# Patient Record
Sex: Female | Born: 1935 | Race: White | Hispanic: No | Marital: Single | Smoking: Never smoker
Health system: Southern US, Community
[De-identification: ages and names within clinical notes are randomized; demographics above are authoritative.]

## PROBLEM LIST (undated history)

## (undated) DIAGNOSIS — E785 Hyperlipidemia, unspecified: Secondary | ICD-10-CM

## (undated) DIAGNOSIS — Z860100 Personal history of colon polyps, unspecified: Secondary | ICD-10-CM

## (undated) DIAGNOSIS — M810 Age-related osteoporosis without current pathological fracture: Secondary | ICD-10-CM

## (undated) HISTORY — DX: Age-related osteoporosis without current pathological fracture: M81.0

## (undated) HISTORY — DX: Personal history of colonic polyps: Z86.010

## (undated) HISTORY — DX: Hyperlipidemia, unspecified: E78.5

## (undated) HISTORY — DX: Personal history of colon polyps, unspecified: Z86.0100

---

## 2005-07-15 HISTORY — PX: BREAST BIOPSY: SHX20

## 2006-07-24 LAB — HM COLONOSCOPY: HM Colonoscopy: NORMAL

## 2010-11-22 ENCOUNTER — Ambulatory Visit: Payer: Self-pay | Admitting: Family Medicine

## 2010-11-27 ENCOUNTER — Ambulatory Visit: Payer: Self-pay | Admitting: Family Medicine

## 2012-05-11 ENCOUNTER — Ambulatory Visit: Payer: Self-pay | Admitting: Family Medicine

## 2012-12-28 ENCOUNTER — Ambulatory Visit: Payer: Self-pay | Admitting: Rheumatology

## 2013-04-14 LAB — HM MAMMOGRAPHY: HM Mammogram: NORMAL

## 2013-05-13 ENCOUNTER — Ambulatory Visit: Payer: Self-pay | Admitting: Family Medicine

## 2013-06-02 ENCOUNTER — Ambulatory Visit: Payer: Self-pay | Admitting: Family Medicine

## 2014-02-24 ENCOUNTER — Ambulatory Visit (INDEPENDENT_AMBULATORY_CARE_PROVIDER_SITE_OTHER): Payer: PRIVATE HEALTH INSURANCE | Admitting: Adult Health

## 2014-02-24 ENCOUNTER — Encounter: Payer: Self-pay | Admitting: Adult Health

## 2014-02-24 ENCOUNTER — Encounter (INDEPENDENT_AMBULATORY_CARE_PROVIDER_SITE_OTHER): Payer: Self-pay

## 2014-02-24 VITALS — BP 129/75 | HR 73 | Temp 97.8°F | Resp 14 | Ht 58.75 in | Wt 114.5 lb

## 2014-02-24 DIAGNOSIS — E785 Hyperlipidemia, unspecified: Secondary | ICD-10-CM

## 2014-02-24 DIAGNOSIS — M810 Age-related osteoporosis without current pathological fracture: Secondary | ICD-10-CM | POA: Insufficient documentation

## 2014-02-24 NOTE — Progress Notes (Signed)
Patient ID: Janet Shields, female   DOB: 1935/10/21, 78 y.o.   MRN: [id number]   Subjective:    Patient ID: Janet Shields, female    DOB: 1935/10/21, 78 y.o.   MRN: [id number]  HPI  Pt is a 78 y/o female who presents to clinic to establish care. Followed by [facility]. She has a hx of HLD and osteoporosis. She takes medications for both and reports compliance.   Past Medical History  Diagnosis Date  . Hyperlipidemia   . Osteoporosis   . History of colon polyps      Past Surgical History  Procedure Laterality Date  . Breast biopsy  2007     Family History  Problem Relation Age of Onset  . Arthritis Mother   . Alcohol abuse Father   . Heart disease Father [id number]    CAD - died  . Hyperlipidemia Brother      History   Social History  . Marital Status: Divorced    Spouse Name: N/A    Number of Children: 1  . 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 Smoking status: Never Smoker   . Smokeless tobacco: Not on file  . Alcohol Use: Yes     Comment: Occasional wine with dinner  . Drug Use: No  . Sexual Activity: Not on file   Other Topics Concern  . Not on file   Social History Narrative   Hobbies: flower gardening, read, travel   Caffeine: 4 cups of coffee   Exercise: 3 miles on treadmill 4-5 a week, resistance exercises at [employer] 2x/week    Review of Systems  Constitutional: Negative.   HENT: Negative.   Eyes: Negative.   Respiratory: Negative.   Cardiovascular: Negative.   Gastrointestinal: Negative.   Endocrine: Negative.   Genitourinary: Negative.   Musculoskeletal: Negative.   Skin: Negative.   Allergic/Immunologic: Negative.   Neurological: Negative.   Hematological: Negative.   Psychiatric/Behavioral: Negative.        Objective:  BP 129/75  Pulse 73  Temp(Src) 97.8 F (36.6 C) (Oral)  Resp 14  Ht 4' 10.75" (1.492 m)  Wt 114 lb 8 oz (51.937 kg)  BMI 23.33 kg/m2   SpO2 99%   Physical Exam  Constitutional: She is oriented to person, place, and time. No distress.  Cardiovascular: Normal rate and regular rhythm.   Pulmonary/Chest: Effort normal. No respiratory distress.  Musculoskeletal: Normal range of motion.  Neurological: She is alert and oriented to person, place, and time.  Skin: Skin is warm and dry.  Psychiatric: She has a normal mood and affect. Her behavior is normal. Judgment and thought content normal.      Assessment & Plan:   1. HLD (hyperlipidemia) On statin. Reports following healthy diet and exercise. Follow. She will schedule her wellness/physical exam in early September where we will draw labs.  2. Osteoporosis On Prolia. Exercises regularly. Follow.

## 2014-02-24 NOTE — Progress Notes (Signed)
Pre visit review using our clinic review tool, if applicable. No additional management support is needed unless otherwise documented below in the visit note. 

## 2014-03-24 ENCOUNTER — Ambulatory Visit (INDEPENDENT_AMBULATORY_CARE_PROVIDER_SITE_OTHER): Payer: PRIVATE HEALTH INSURANCE | Admitting: Adult Health

## 2014-03-24 ENCOUNTER — Encounter: Payer: Self-pay | Admitting: Adult Health

## 2014-03-24 DIAGNOSIS — Z23 Encounter for immunization: Secondary | ICD-10-CM

## 2014-03-24 DIAGNOSIS — Z136 Encounter for screening for cardiovascular disorders: Secondary | ICD-10-CM

## 2014-03-24 DIAGNOSIS — Z0000 Encounter for general adult medical examination without abnormal findings: Secondary | ICD-10-CM

## 2014-03-24 DIAGNOSIS — Z131 Encounter for screening for diabetes mellitus: Secondary | ICD-10-CM

## 2014-03-24 NOTE — Progress Notes (Signed)
Subjective:    Janet Shields is a 78 y.o. female who presents for Medicare Annual/Subsequent preventive examination.  Preventive Screening-Counseling & Management  Tobacco History  Smoking status  . Never Smoker   Smokeless tobacco  . Not on file     Problems Prior to Visit 1.   Current Problems (verified) Patient Active Problem List   Diagnosis Date Noted  . HLD (hyperlipidemia) 02/24/2014  . Osteoporosis 02/24/2014    Medications Prior to Visit Current Outpatient Prescriptions on File Prior to Visit  Medication Sig Dispense Refill  . aspirin 81 MG tablet Take 81 mg by mouth daily.      Marland Kitchen BIOTIN 5000 PO Take 1 tablet by mouth daily.      . Calcium Citrate-Vitamin D (CAL-CITRATE PLUS VITAMIN D PO) Take 1 tablet by mouth daily.      Marland Kitchen denosumab (PROLIA) 60 MG/ML SOLN injection Inject 60 mg into the skin every 6 (six) months. Administer in upper arm, thigh, or abdomen      . Multiple Vitamin (MULTIVITAMIN) tablet Take 1 tablet by mouth daily.      . simvastatin (ZOCOR) 10 MG tablet Take 10 mg by mouth daily.       No current facility-administered medications on file prior to visit.    Current Medications (verified) Current Outpatient Prescriptions  Medication Sig Dispense Refill  . aspirin 81 MG tablet Take 81 mg by mouth daily.      Marland Kitchen BIOTIN 5000 PO Take 1 tablet by mouth daily.      . Calcium Citrate-Vitamin D (CAL-CITRATE PLUS VITAMIN D PO) Take 1 tablet by mouth daily.      Marland Kitchen denosumab (PROLIA) 60 MG/ML SOLN injection Inject 60 mg into the skin every 6 (six) months. Administer in upper arm, thigh, or abdomen      . Multiple Vitamin (MULTIVITAMIN) tablet Take 1 tablet by mouth daily.      . simvastatin (ZOCOR) 10 MG tablet Take 10 mg by mouth daily.       No current facility-administered medications for this visit.     Allergies (verified) Review of patient's allergies indicates no known allergies.   PAST HISTORY  Family History Family History  Problem  Relation Age of Onset  . Arthritis Mother   . Alcohol abuse Father   . Heart disease Father 67    CAD - died  . Hyperlipidemia Brother     Social History History  Substance Use Topics  . Smoking status: Never Smoker   . Smokeless tobacco: Not on file  . Alcohol Use: Yes     Comment: Occasional wine with dinner     Are there smokers in your home (other than you)? No  Risk Factors Current exercise habits: Home exercise routine includes treadmill.  Dietary issues discussed: Follows healthy diet. Reports having a sweet tooth.   Cardiac risk factors: advanced age (older than 27 for men, 54 for women), dyslipidemia and family history of premature cardiovascular disease.  Depression Screen (Note: if answer to either of the following is "Yes", a more complete depression screening is indicated)   Over the past two weeks, have you felt down, depressed or hopeless? No  Over the past two weeks, have you felt little interest or pleasure in doing things? No  Have you lost interest or pleasure in daily life? No  Do you often feel hopeless? No  Do you cry easily over simple problems? No  Activities of Daily Living In your present state of health,  do you have any difficulty performing the following activities?:  Driving? No Managing money?  No Feeding yourself? Yes Getting from bed to chair? NoNo exam performed today, medicare wellness. Climbing a flight of stairs? No Preparing food and eating?: No Bathing or showering? No Getting dressed: No Getting to the toilet? No Using the toilet:No Moving around from place to place: No In the past year have you fallen or had a near fall?:No   Are you sexually active?  No  Do you have more than one partner?  No  Hearing Difficulties: No Do you often ask people to speak up or repeat themselves? No Do you experience ringing or noises in your ears? Yes Do you have difficulty understanding soft or whispered voices? No   Do you feel that you  have a problem with memory? No  Do you often misplace items? No  Do you feel safe at home?  Yes  Cognitive Testing  Alert? Yes  Normal Appearance?Yes  Oriented to person? Yes  Place? Yes   Time? Yes  Recall of three objects?  Yes  Can perform simple calculations? Yes  Displays appropriate judgment?Yes  Can read the correct time from a watch face?Yes   Advanced Directives have been discussed with the patient? Yes  List the Names of Other Physician/Practitioners you currently use: 1.  Dr. Gavin Potters - Rheumatologist 2.  [facility] 3.  Dr. Allayne Butcher - Dentist  Indicate any recent Medical Services you may have received from other than [facility] providers in the past year (date may be approximate).   There is no immunization history on file for this patient.  Screening Tests Health Maintenance  Topic Date Due  . Tetanus/tdap  04/30/1957  . Mammogram  04/30/1988  . Colonoscopy  04/30/1988  . Zostavax  04/30/1998  . Pneumococcal Polysaccharide Vaccine Age 42 And Over  05/01/2003  . Influenza Vaccine  02/12/2014    All answers were reviewed with the patient and necessary referrals were made:  Lonette Stevison, NP   03/24/2014   History reviewed: allergies, current medications, past family history, past medical history, past social history, past surgical history and problem list  Review of Systems No ROS. Medicare Wellness    Objective:     Vision by Snellen chart: right eye:20/30, left eye:20/20  There is no weight on file to calculate BMI. There were no vitals taken for this visit.  No exam performed today, Medicare Wellness.     Assessment:      This is a routine wellness  examination for this patient . I reviewed all health maintenance protocols including mammography, colonoscopy, bone density Needed referrals were placed. Age and diagnosis  appropriate screening labs were ordered. Her immunization history was reviewed and appropriate vaccinations were ordered. Her current  medications and allergies were reviewed and needed refills of her chronic medications were ordered. The plan for yearly health maintenance was discussed all orders and referrals were made as appropriate.      Plan:     During the course of the visit the patient was educated and counseled about appropriate screening and preventive services including:    Pneumococcal vaccine   Influenza vaccine  Td vaccine  Screening mammography  Bone densitometry screening  Colorectal cancer screening  Diabetes screening  Glaucoma screening  Advanced directives: has an advanced directive - a copy HAS NOT been provided.  Pt will get the Prevnar vaccination today. Mammogram due in October and she will schedule appt. Colonoscopy due in 2018. Bone density  due in May 2016. Glaucoma screening yearly through [facility]. Tetanus received in 2010. Shingles vaccine received in 2008. Screening labs  Diet review for nutrition referral? Yes ____  Not Indicated ___x_   Patient Instructions (the written plan) was given to the patient.  Medicare Attestation I have personally reviewed: The patient's medical and social history Their use of alcohol, tobacco or illicit drugs Their current medications and supplements The patient's functional ability including ADLs,fall risks, home safety risks, cognitive, and hearing and visual impairment Diet and physical activities Evidence for depression or mood disorders  The patient's weight, height, BMI, and visual acuity have been recorded in the chart.  I have made referrals, counseling, and provided education to the patient based on review of the above and I have provided the patient with a written personalized care plan for preventive services.     Lashanda Storlie, NP   03/24/2014

## 2014-03-24 NOTE — Patient Instructions (Addendum)
  You had your Medicare Wellness Screening today.  Return for fasting labs. We will contact you with results once available.  You are up to date on your colonoscopy, bone density, tetanus, shingles vaccine.  You are due for your mammogram next month. You reported you will schedule an appointment. Please call if you need an order to schedule.  You received your Prevnar (pneumonia) vaccine.

## 2014-03-24 NOTE — Progress Notes (Signed)
Pre visit review using our clinic review tool, if applicable. No additional management support is needed unless otherwise documented below in the visit note. 

## 2014-03-24 NOTE — Addendum Note (Signed)
Addended by: Melene Plan on: 03/24/2014 11:25 AM   Modules accepted: Orders

## 2014-03-25 ENCOUNTER — Other Ambulatory Visit (INDEPENDENT_AMBULATORY_CARE_PROVIDER_SITE_OTHER): Payer: PRIVATE HEALTH INSURANCE

## 2014-03-25 DIAGNOSIS — Z0000 Encounter for general adult medical examination without abnormal findings: Secondary | ICD-10-CM

## 2014-03-25 DIAGNOSIS — Z136 Encounter for screening for cardiovascular disorders: Secondary | ICD-10-CM

## 2014-03-25 DIAGNOSIS — Z131 Encounter for screening for diabetes mellitus: Secondary | ICD-10-CM

## 2014-03-25 LAB — HEPATIC FUNCTION PANEL
ALBUMIN: 4 g/dL (ref 3.5–5.2)
ALT: 21 U/L (ref 0–35)
AST: 22 U/L (ref 0–37)
Alkaline Phosphatase: 31 U/L — ABNORMAL LOW (ref 39–117)
BILIRUBIN TOTAL: 0.6 mg/dL (ref 0.2–1.2)
Bilirubin, Direct: 0.1 mg/dL (ref 0.0–0.3)
TOTAL PROTEIN: 6.6 g/dL (ref 6.0–8.3)

## 2014-03-25 LAB — CBC WITH DIFFERENTIAL/PLATELET
BASOS PCT: 0.8 % (ref 0.0–3.0)
Basophils Absolute: 0 10*3/uL (ref 0.0–0.1)
Eosinophils Absolute: 0.1 10*3/uL (ref 0.0–0.7)
Eosinophils Relative: 3 % (ref 0.0–5.0)
HCT: 41.8 % (ref 36.0–46.0)
HEMOGLOBIN: 13.9 g/dL (ref 12.0–15.0)
LYMPHS PCT: 26.5 % (ref 12.0–46.0)
Lymphs Abs: 1 10*3/uL (ref 0.7–4.0)
MCHC: 33.2 g/dL (ref 30.0–36.0)
MCV: 90.2 fl (ref 78.0–100.0)
MONOS PCT: 15.3 % — AB (ref 3.0–12.0)
Monocytes Absolute: 0.6 10*3/uL (ref 0.1–1.0)
Neutro Abs: 2.1 10*3/uL (ref 1.4–7.7)
Neutrophils Relative %: 54.4 % (ref 43.0–77.0)
Platelets: 253 10*3/uL (ref 150.0–400.0)
RBC: 4.63 Mil/uL (ref 3.87–5.11)
RDW: 13.3 % (ref 11.5–15.5)
WBC: 3.8 10*3/uL — AB (ref 4.0–10.5)

## 2014-03-25 LAB — LIPID PANEL
CHOLESTEROL: 188 mg/dL (ref 0–200)
HDL: 60.2 mg/dL (ref >39.00)
LDL Cholesterol: 115 mg/dL — ABNORMAL HIGH (ref 0–99)
NONHDL: 127.8
Total CHOL/HDL Ratio: 3
Triglycerides: 66 mg/dL (ref 0.0–149.0)
VLDL: 13.2 mg/dL (ref 0.0–40.0)

## 2014-03-25 LAB — HEMOGLOBIN A1C: HEMOGLOBIN A1C: 6 % (ref 4.6–6.5)

## 2014-03-25 LAB — C-REACTIVE PROTEIN

## 2014-05-20 ENCOUNTER — Ambulatory Visit: Payer: Self-pay | Admitting: Internal Medicine

## 2014-05-20 LAB — HM MAMMOGRAPHY: HM Mammogram: NEGATIVE

## 2014-05-23 ENCOUNTER — Encounter: Payer: Self-pay | Admitting: *Deleted

## 2014-05-31 ENCOUNTER — Other Ambulatory Visit: Payer: Self-pay | Admitting: Internal Medicine

## 2014-10-06 ENCOUNTER — Emergency Department: Payer: Self-pay | Admitting: Emergency Medicine

## 2014-10-06 ENCOUNTER — Telehealth: Payer: Self-pay | Admitting: *Deleted

## 2014-10-06 NOTE — Telephone Encounter (Signed)
Pt walked into clinic, complaining of falling and hitting her head on the concrete at [facility] about 10 minutes ago. She thought clinic had xray and she could be seen. Pt has a large hematoma above left eye. Given ice pack to apply to injury. Denies loss of consciousness, doesn't know what caused her to trip. Denies use of blood thinners. Pt's brother has been contacted to drive her to the ED, pt declines EMS to transport. Pt waiting in lobby and Lyla SonCarrie notified to evaluate.
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
## 2014-11-26 ENCOUNTER — Other Ambulatory Visit: Payer: Self-pay | Admitting: Internal Medicine

## 2014-11-28 ENCOUNTER — Telehealth: Payer: Self-pay | Admitting: *Deleted

## 2014-11-28 NOTE — Telephone Encounter (Signed)
Last OV 9.10.15.  Please advise refill

## 2014-11-28 NOTE — Telephone Encounter (Signed)
Refill request for Simvastatin came in from pharmacy.  Former pt os [facility].  I called pt to schedule est care/med refill with you.  Pt requests lab appoint prior to OV.  Pt scheduled, please enter lab orders.

## 2014-11-29 ENCOUNTER — Other Ambulatory Visit (INDEPENDENT_AMBULATORY_CARE_PROVIDER_SITE_OTHER): Payer: PRIVATE HEALTH INSURANCE

## 2014-11-29 ENCOUNTER — Other Ambulatory Visit: Payer: Self-pay | Admitting: Nurse Practitioner

## 2014-11-29 DIAGNOSIS — E785 Hyperlipidemia, unspecified: Secondary | ICD-10-CM | POA: Diagnosis not present

## 2014-11-29 LAB — LIPID PANEL
CHOL/HDL RATIO: 3
Cholesterol: 217 mg/dL — ABNORMAL HIGH (ref 0–200)
HDL: 80.6 mg/dL (ref >39.00)
LDL Cholesterol: 118 mg/dL — ABNORMAL HIGH (ref 0–99)
NONHDL: 136.4
Triglycerides: 93 mg/dL (ref 0.0–149.0)
VLDL: 18.6 mg/dL (ref 0.0–40.0)

## 2014-11-29 LAB — BASIC METABOLIC PANEL
BUN: 14 mg/dL (ref 6–23)
CHLORIDE: 100 meq/L (ref 96–112)
CO2: 30 mEq/L (ref 19–32)
Calcium: 9.7 mg/dL (ref 8.4–10.5)
Creatinine, Ser: 0.64 mg/dL (ref 0.40–1.20)
GFR: 95.74 mL/min (ref >60.00)
Glucose, Bld: 91 mg/dL (ref 70–99)
POTASSIUM: 4.3 meq/L (ref 3.5–5.1)
SODIUM: 136 meq/L (ref 135–145)

## 2014-11-29 NOTE — Telephone Encounter (Signed)
Orders were placed. Thanks.

## 2014-12-01 ENCOUNTER — Encounter: Payer: Self-pay | Admitting: Nurse Practitioner

## 2014-12-01 ENCOUNTER — Ambulatory Visit (INDEPENDENT_AMBULATORY_CARE_PROVIDER_SITE_OTHER): Payer: PRIVATE HEALTH INSURANCE | Admitting: Nurse Practitioner

## 2014-12-01 VITALS — BP 122/60 | HR 85 | Temp 98.1°F | Resp 14 | Ht 58.75 in | Wt 118.0 lb

## 2014-12-01 DIAGNOSIS — E785 Hyperlipidemia, unspecified: Secondary | ICD-10-CM | POA: Diagnosis not present

## 2014-12-01 NOTE — Progress Notes (Signed)
Pre visit review using our clinic review tool, if applicable. No additional management support is needed unless otherwise documented below in the visit note. 

## 2014-12-01 NOTE — Patient Instructions (Signed)
See you in Sept. For your annual wellness visit.

## 2014-12-01 NOTE — Progress Notes (Signed)
   Subjective:    Patient ID: Janet Shields, female    DOB: 08-19-1935, 79 y.o.   MRN: [id number]  HPI  [name] is is a 79 yo female wanting to discuss her Zocor.   1) Asked questions regarding labs and Zocor Taking most days, forgets at least once a week.   Review of Systems  Constitutional: Negative for fever, chills, diaphoresis and fatigue.  Respiratory: Negative for chest tightness, shortness of breath and wheezing.   Cardiovascular: Negative for chest pain, palpitations and leg swelling.  Gastrointestinal: Negative for nausea, vomiting and diarrhea.  Skin: Negative for rash.  Neurological: Negative for dizziness, weakness, numbness and headaches.  Psychiatric/Behavioral: The patient is not nervous/anxious.        Objective:   Physical Exam  Constitutional: She is oriented to person, place, and time. She appears well-developed and well-nourished. No distress.  BP 122/60 mmHg  Pulse 85  Temp(Src) 98.1 F (36.7 C)  Resp 14  Ht 4' 10.75" (1.492 m)  Wt 118 lb (53.524 kg)  BMI 24.04 kg/m2  SpO2 97%   HENT:  Head: Normocephalic and atraumatic.  Right Ear: External ear normal.  Left Ear: External ear normal.  Cardiovascular: Normal rate, regular rhythm and normal heart sounds.  Exam reveals no gallop and no friction rub.   No murmur heard. Pulmonary/Chest: Effort normal and breath sounds normal. No respiratory distress. She has no wheezes. She has no rales. She exhibits no tenderness.  Neurological: She is alert and oriented to person, place, and time. No cranial nerve deficit. She exhibits normal muscle tone. Coordination normal.  Skin: Skin is warm and dry. No rash noted. She is not diaphoretic.  Psychiatric: She has a normal mood and affect. Her behavior is normal. Judgment and thought content normal.        Assessment & Plan:

## 2014-12-17 NOTE — Assessment & Plan Note (Signed)
Lipid panel from 5/17 shows need to stay on Zocor 10 mg. Due to age will not up to next dosage level. Taking Omega-3's and discussed diet and exercise. Encouraged her to take daily and continue ASA 81 mg.

## 2014-12-20 ENCOUNTER — Other Ambulatory Visit: Payer: Self-pay | Admitting: Rheumatology

## 2014-12-20 DIAGNOSIS — M810 Age-related osteoporosis without current pathological fracture: Secondary | ICD-10-CM

## 2015-04-24 ENCOUNTER — Other Ambulatory Visit: Payer: Self-pay | Admitting: Internal Medicine

## 2015-04-24 DIAGNOSIS — Z1231 Encounter for screening mammogram for malignant neoplasm of breast: Secondary | ICD-10-CM

## 2015-05-04 ENCOUNTER — Ambulatory Visit (INDEPENDENT_AMBULATORY_CARE_PROVIDER_SITE_OTHER): Payer: PRIVATE HEALTH INSURANCE

## 2015-05-04 VITALS — BP 122/68 | HR 83 | Temp 98.1°F | Resp 12 | Ht 59.5 in | Wt 118.6 lb

## 2015-05-04 DIAGNOSIS — Z0000 Encounter for general adult medical examination without abnormal findings: Secondary | ICD-10-CM | POA: Diagnosis not present

## 2015-05-04 MED ORDER — SIMVASTATIN 10 MG PO TABS: 10.0000 mg | ORAL_TABLET | Freq: Every day | ORAL | Status: DC

## 2015-05-04 NOTE — Patient Instructions (Signed)
Janet Shields,  Thank you for taking time to come for your Medicare Wellness Visit.  I appreciate your ongoing commitment to your health goals. Please review the following plan we discussed and let me know if I can assist you in the future.

## 2015-05-04 NOTE — Progress Notes (Signed)
Subjective:   Janet Shields is a 79 y.o. female who presents for Medicare Annual (Subsequent) preventive examination.  Review of Systems:  No ROS.  Medicare Wellness Visit.  Cardiac Risk Factors include: advanced age (>64men, >45 women)     Objective:     Vitals: BP 122/68 mmHg  Pulse 83  Temp(Src) 98.1 F (36.7 C) (Oral)  Resp 12  Ht 4' 11.5" (1.511 m)  Wt 118 lb 9.6 oz (53.797 kg)  BMI 23.56 kg/m2  SpO2 97%  Tobacco History  Smoking status  . Never Smoker   Smokeless tobacco  . Never Used     Counseling given: Not Answered   Past Medical History  Diagnosis Date  . Hyperlipidemia   . Osteoporosis   . History of colon polyps    Past Surgical History  Procedure Laterality Date  . Breast biopsy  2007   Family History  Problem Relation Age of Onset  . Arthritis Mother   . Alcohol abuse Father   . Heart disease Father 35    CAD - died  . Hyperlipidemia Brother   . Hypertension Brother   . Diabetes Brother   . Hyperlipidemia Brother    History  Sexual Activity  . Sexual Activity: Not Currently    Outpatient Encounter Prescriptions as of 05/04/2015  Medication Sig  . aspirin 81 MG tablet Take 81 mg by mouth daily.  . Calcium Citrate-Vitamin D (CAL-CITRATE PLUS VITAMIN D PO) Take 1 tablet by mouth daily.  Marland Kitchen denosumab (PROLIA) 60 MG/ML SOLN injection Inject 60 mg into the skin every 6 (six) months. Administer in upper arm, thigh, or abdomen  . Multiple Vitamin (MULTIVITAMIN) tablet Take 1 tablet by mouth daily.  . simvastatin (ZOCOR) 10 MG tablet Take 1 tablet (10 mg total) by mouth daily.  . [DISCONTINUED] simvastatin (ZOCOR) 10 MG tablet TAKE 1 TABLET BY MOUTH EVERY DAY  . BIOTIN 5000 PO Take 1 tablet by mouth daily.  . [DISCONTINUED] Omega-3 Fatty Acids (OMEGA 3 PO) Take by mouth.   No facility-administered encounter medications on file as of 05/04/2015.    Activities of Daily Living In your present state of health, do you have any difficulty  performing the following activities: 05/04/2015  Hearing? N  Vision? N  Difficulty concentrating or making decisions? N  Walking or climbing stairs? N  Dressing or bathing? N  Doing errands, shopping? N  Preparing Food and eating ? N  Using the Toilet? N  In the past six months, have you accidently leaked urine? N  Do you have problems with loss of bowel control? N  Managing your Medications? N  Managing your Finances? N  Housekeeping or managing your Housekeeping? N    Patient Care Team: Carollee Leitz, NP as PCP - General (Nurse Practitioner)    Assessment:    This is a routine medicare annual wellness visit for Janet Shields. The goal of the wellness visit is to assist the patient how to close the gaps in care and create a preventative care plan for the patient.   Bone Density/Dexa Scan discussed. Osteoporosis risk reviewed.  Patient states DXA previously completed with [facility].  Health maintenance updated.  TDAP- Patient states previoulsy completed in 2008. Health maintenance updated.  Influenza vaccine- declined.  Taking meds without issues; no barriers identified.  Safety issues reviewed; smoke detectors in the home. Firearms locked in a secure area. Wears seatbelts when driving or riding with others. No violence in the home.  The patient was  oriented x 3; appropriate in dress and manner and no objective failures at ADL's or IADL's.   Exercise Activities and Dietary recommendations Current Exercise Habits:: Home exercise routine, Type of exercise: treadmill;walking, Time (Minutes): 30, Frequency (Times/Week): 4, Intensity: Moderate  Goals    . Reduce sugar intake to X grams per day     Brush teeth and floss earlier in the evening to decrease the desire to eat as much chocolate/sugar. Eat more leafy green vegetables.       Fall Risk Fall Risk  05/04/2015 03/24/2014 03/24/2014  Falls in the past year? Yes No No  Number falls in past yr: 1 - -  Injury with  Fall? Yes - -  Risk for fall due to : Other (Comment) - -  Risk for fall due to (comments): Loss footing. - -  Follow up Falls prevention discussed;Education provided - -   Depression Screen PHQ 2/9 Scores 05/04/2015 03/24/2014 03/24/2014  PHQ - 2 Score 0 0 0     Cognitive Testing MMSE - Mini Mental State Exam 05/04/2015  Orientation to time 5  Orientation to Place 5  Registration 3  Attention/ Calculation 5  Recall 3  Language- name 2 objects 2  Language- repeat 1  Language- follow 3 step command 3  Language- read & follow direction 1  Write a sentence 1  Copy design 1  Total score 30    Immunization History  Administered Date(s) Administered  . DTaP 10/17/2008  . Pneumococcal Conjugate-13 03/24/2014  . Pneumococcal Polysaccharide-23 12/23/2006  . Zoster 12/23/2006   Screening Tests Health Maintenance  Topic Date Due  . INFLUENZA VACCINE  10/13/2015 (Originally 02/13/2015)  . TETANUS/TDAP  10/14/2016  . DEXA SCAN  Addressed  . ZOSTAVAX  Completed  . PNA vac Low Risk Adult  Completed      Plan:   End of life/Advanced Care palnning was discussed. Return copy of completed Advanced Directives.  Pl[facility].  Follow up in 1 year or sooner with PCP, if medical attention is needed.  During the course of the visit the patient was educated and counseled about the following appropriate screening and preventive services:   Vaccines to include Pneumoccal, Influenza, Hepatitis B, Td, Zostavax, HCV  Electrocardiogram  Cardiovascular Disease  Colorectal cancer screening  Bone density screening  Diabetes screening  Glaucoma screening  Mammography/PAP  Nutrition counseling   Patient Instructions (the written plan) was given to the patient.   Ashok PallOBrien-Blaney, Geraline Halberstadt L, LPN  16/10/960410/20/2016

## 2015-05-09 NOTE — Progress Notes (Signed)
I have read the note and plan by D. O'Brien-Blaney, LPN and agree with the above information and plan.   Naomie DeanCarrie Jacub Waiters, NP-C 05/09/15 [phone]

## 2015-05-22 ENCOUNTER — Ambulatory Visit
Admission: RE | Admit: 2015-05-22 | Discharge: 2015-05-22 | Disposition: A | Discharge status: Home / Self Care | Payer: BLUE CROSS/BLUE SHIELD | Source: Ambulatory Visit | Attending: Internal Medicine | Admitting: Internal Medicine

## 2015-05-22 DIAGNOSIS — Z1231 Encounter for screening mammogram for malignant neoplasm of breast: Secondary | ICD-10-CM | POA: Diagnosis not present

## 2015-08-01 ENCOUNTER — Other Ambulatory Visit: Payer: Self-pay | Admitting: Nurse Practitioner

## 2015-11-08 IMAGING — CT CT HEAD WITHOUT CONTRAST
1 series · 16 of 30 positions shown, 20 images · non-contrast
Comparison: None.

CLINICAL DATA: Head trauma secondary to a fall today. Scalp
hematoma above the left eye.

EXAM:
CT HEAD WITHOUT CONTRAST
TECHNIQUE: Contiguous axial images were obtained from the base of the skull
through the vertex without intravenous contrast.

[Series 2: head wo · axial · 0.41mm/px · z∈[+454,+580]mm · 16 of 32 slices shown, 20 images]
[im 2/32  brain]
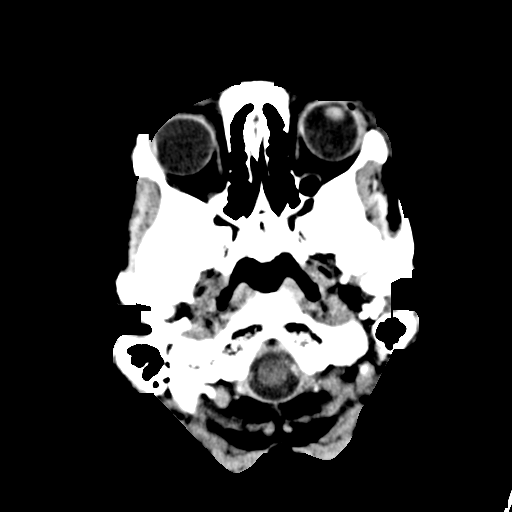
[im 2/32  bone]
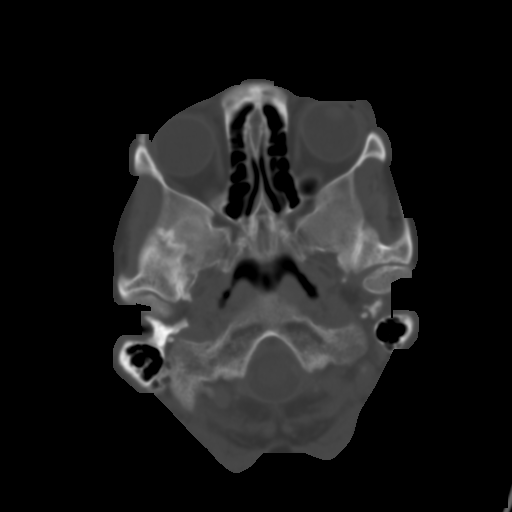
[im 4/32  brain]
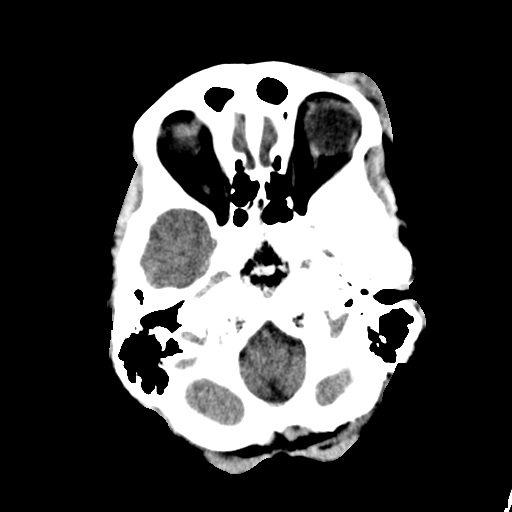
[im 6/32  brain]
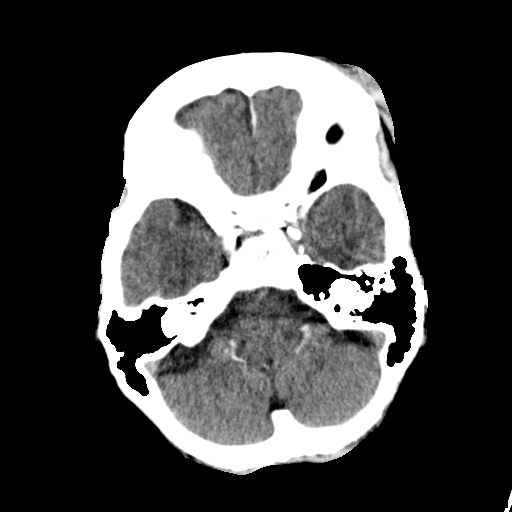
[im 8/32  brain]
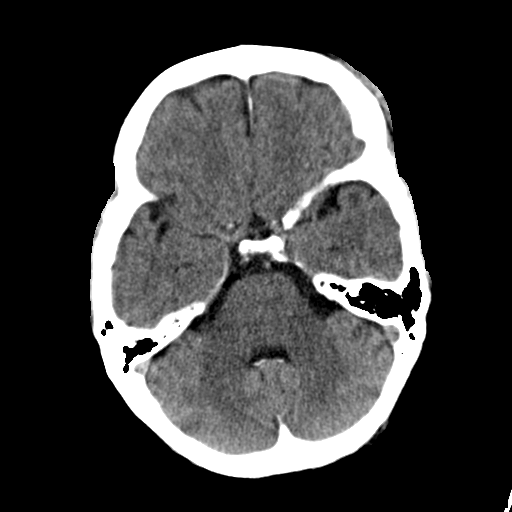
[im 9/32  brain]
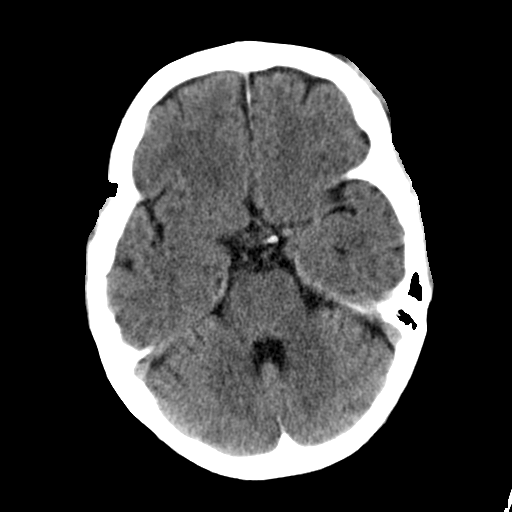
[im 9/32  bone]
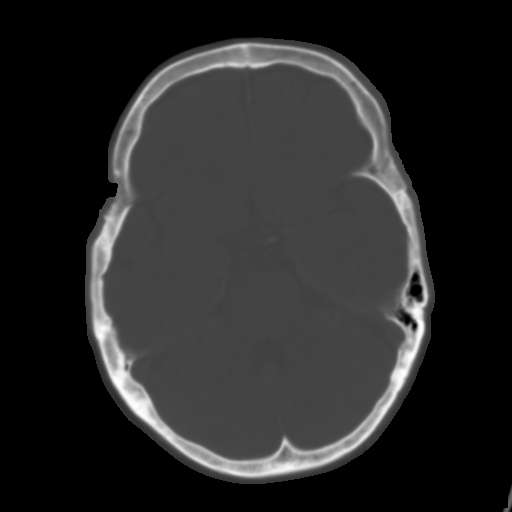
[im 11/32  brain]
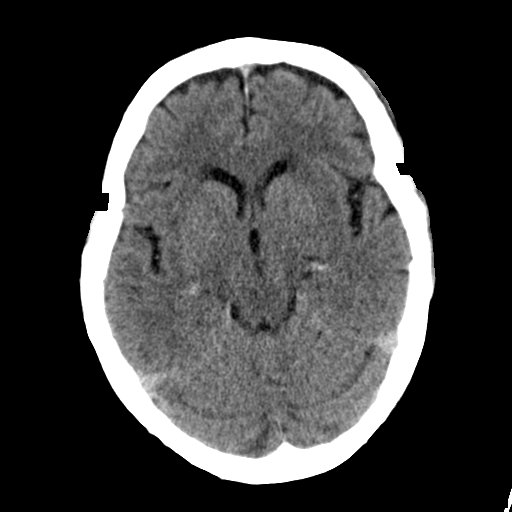
[im 13/32  brain]
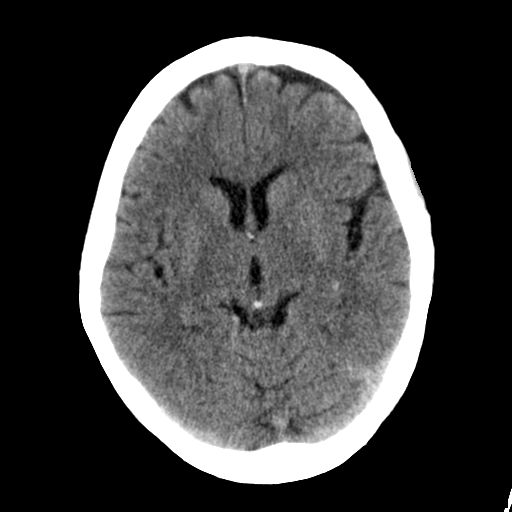
[im 15/32  brain]
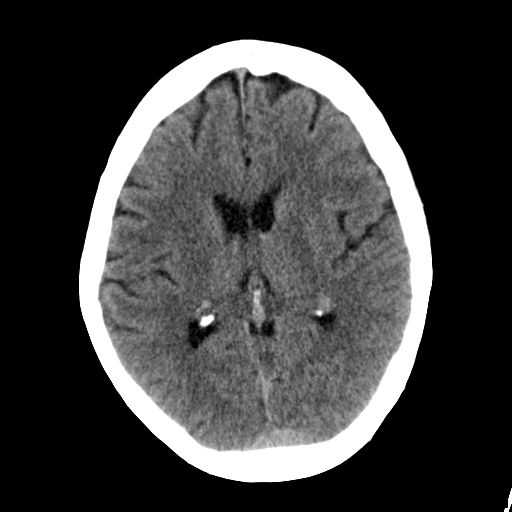
[im 17/32  brain]
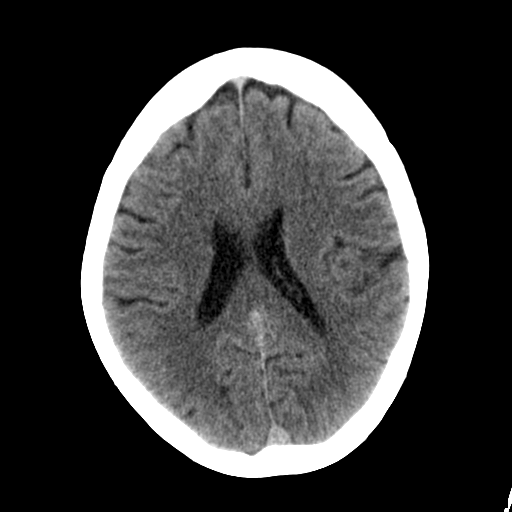
[im 17/32  bone]
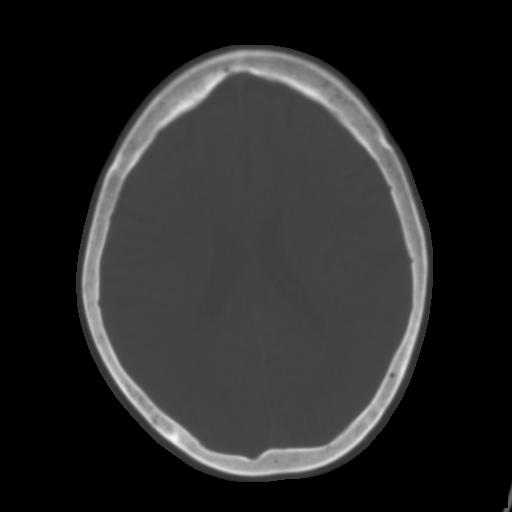
[im 19/32  brain]
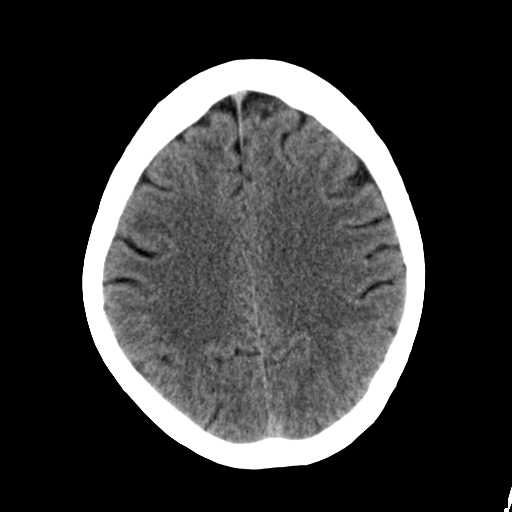
[im 21/32  brain]
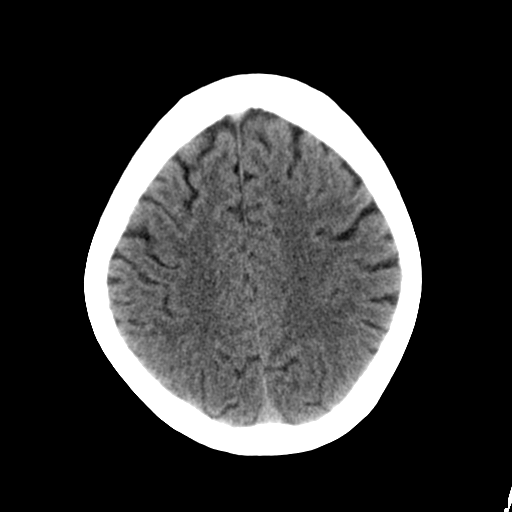
[im 23/32  brain]
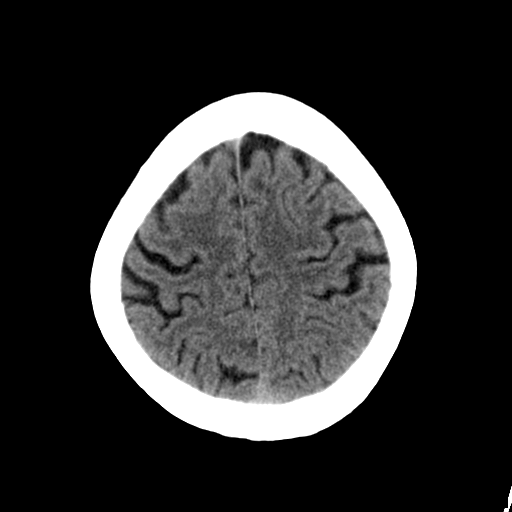
[im 24/32  brain]
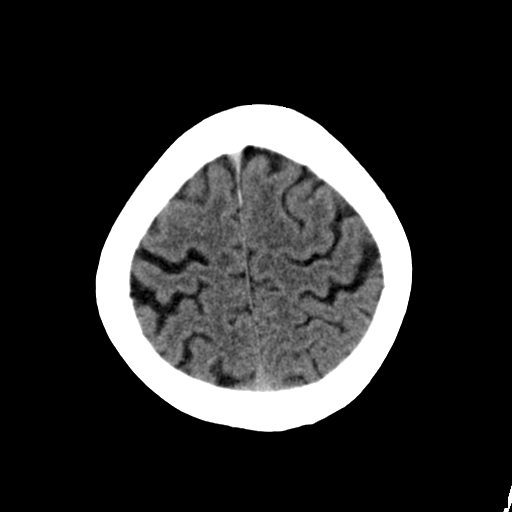
[im 24/32  bone]
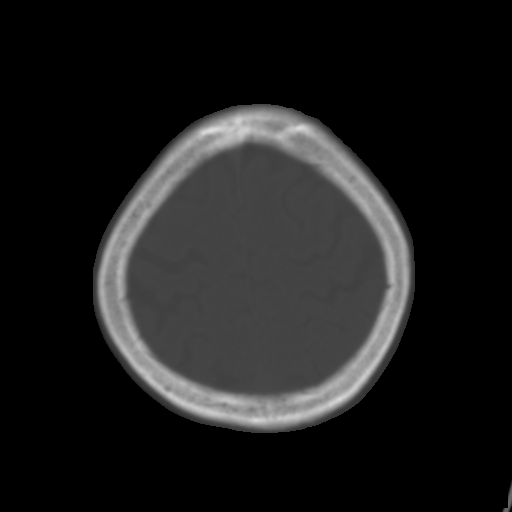
[im 26/32  brain]
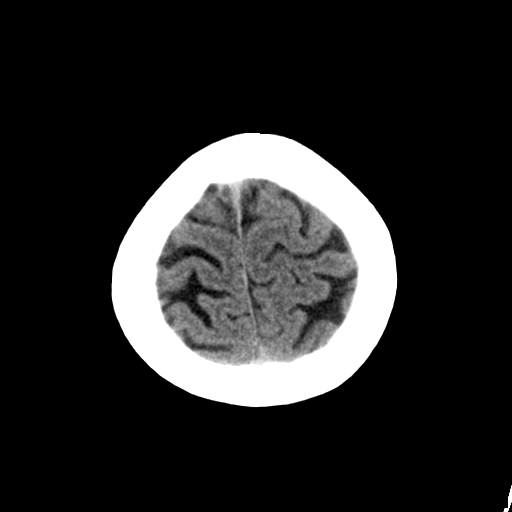
[im 28/32  brain]
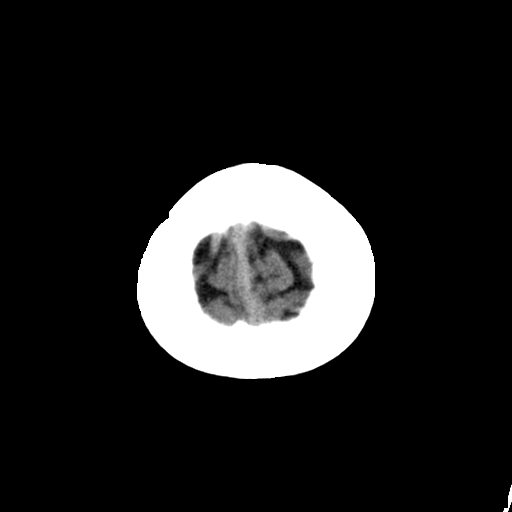
[im 30/32  brain]
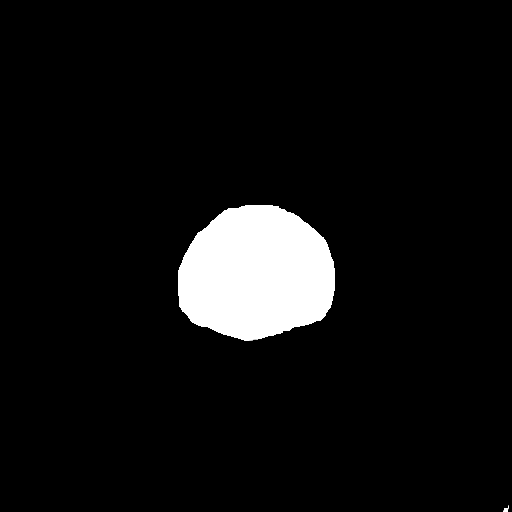

[16 of 30 positions shown; findings below may reference images not displayed]

FINDINGS: No intracranial mass lesion. No midline shift. No acute intracranial
hemorrhage or intracranial hematoma. No extra-axial fluid
collections. No evidence of acute infarction. Normal brain
parenchyma. Normal osseous structures.

There is a prominent scalp hematoma over the left superior orbital
rim.
IMPRESSION: Scalp hematoma.  Otherwise, normal exam.

## 2015-11-14 ENCOUNTER — Telehealth: Payer: Self-pay | Admitting: *Deleted

## 2015-11-14 NOTE — Telephone Encounter (Signed)
Please advise 

## 2015-11-14 NOTE — Telephone Encounter (Signed)
Please call pt and find out what is going on.

## 2015-11-14 NOTE — Telephone Encounter (Signed)
Patient states that she has not had recent labs this year. She wanted to know if that was due to insurance? She needs labs to continue getting Prolia injections her osteoporosis doctor will call for results. She is wanting a Lipid panel and CMET. She states that if this is okay to have ordered we can schedule for 11/30/15 on early morning due to fasting needed.

## 2015-11-14 NOTE — Telephone Encounter (Signed)
Patient has requested a call from [place], in regards to blood work being ordered.  Pt. [phone]

## 2015-11-14 NOTE — Telephone Encounter (Signed)
I recommend- since I will not be able to access reports of people's labs after 11/24/15 that whomever she establishes with moving forward place these orders so that the results come to them.

## 2015-11-27 ENCOUNTER — Telehealth: Payer: Self-pay | Admitting: *Deleted

## 2015-11-27 NOTE — Telephone Encounter (Signed)
Cathy from [facility] requested Pt latest lab results for Calcium and [employer]  Fax [phone] Attn Lynden Angathy

## 2015-11-27 NOTE — Telephone Encounter (Signed)
Printed and faxed as requested. Thanks

## 2016-01-05 ENCOUNTER — Telehealth: Payer: Self-pay | Admitting: Internal Medicine

## 2016-01-05 NOTE — Telephone Encounter (Signed)
Pt called wanting to get labs done before appt on 08/02. Need orders please and thank you!  Call pt @ [phone].

## 2016-01-05 NOTE — Telephone Encounter (Signed)
Patient needs to Establish care with Dr. Dan HumphreysWalker and be seen prior to Blood work. Patient will call and schedule appointment at a later date. Patient understood and had no questions or concerns at this time.

## 2016-01-26 ENCOUNTER — Other Ambulatory Visit: Payer: Self-pay | Admitting: Nurse Practitioner

## 2016-02-13 ENCOUNTER — Ambulatory Visit: Payer: PRIVATE HEALTH INSURANCE | Admitting: Internal Medicine

## 2016-02-14 ENCOUNTER — Ambulatory Visit (INDEPENDENT_AMBULATORY_CARE_PROVIDER_SITE_OTHER): Payer: BLUE CROSS/BLUE SHIELD | Admitting: Family

## 2016-02-14 ENCOUNTER — Encounter: Payer: Self-pay | Admitting: Family

## 2016-02-14 ENCOUNTER — Ambulatory Visit: Payer: PRIVATE HEALTH INSURANCE | Admitting: Internal Medicine

## 2016-02-14 VITALS — BP 126/66 | HR 67 | Temp 97.8°F | Ht 59.5 in | Wt 112.0 lb

## 2016-02-14 DIAGNOSIS — R6889 Other general symptoms and signs: Secondary | ICD-10-CM

## 2016-02-14 DIAGNOSIS — Z1239 Encounter for other screening for malignant neoplasm of breast: Secondary | ICD-10-CM | POA: Insufficient documentation

## 2016-02-14 DIAGNOSIS — Z0001 Encounter for general adult medical examination with abnormal findings: Secondary | ICD-10-CM | POA: Diagnosis not present

## 2016-02-14 DIAGNOSIS — M810 Age-related osteoporosis without current pathological fracture: Secondary | ICD-10-CM

## 2016-02-14 DIAGNOSIS — E785 Hyperlipidemia, unspecified: Secondary | ICD-10-CM

## 2016-02-14 DIAGNOSIS — R21 Rash and other nonspecific skin eruption: Secondary | ICD-10-CM

## 2016-02-14 MED ORDER — TRIAMCINOLONE ACETONIDE 0.025 % EX CREA: 1.0000 "application " | TOPICAL_CREAM | Freq: Two times a day (BID) | CUTANEOUS | 0 refills | Status: DC

## 2016-02-14 NOTE — Assessment & Plan Note (Signed)
Pending lipid panel. On Zocor.

## 2016-02-14 NOTE — Assessment & Plan Note (Addendum)
Symptoms c/w eczema. No joint pain to suggest plaque psoriasis. Alternately, considering contact dermatitis. Trial of emollient and then short course of topical low potency steroid.

## 2016-02-14 NOTE — Progress Notes (Signed)
Pre visit review using our clinic review tool, if applicable. No additional management support is needed unless otherwise documented below in the visit note. 

## 2016-02-14 NOTE — Assessment & Plan Note (Addendum)
Due for mammogram- ordered. No longer doing Pap smear or pelvic exam. Due for colonoscopy 2018. Discussed Cologuard versus colonoscopy. Patient stated she would call with decision as to which one and would go ahead an place order. Immunizations UTD. Screening labs ordered. On Prolia and follows with Rheumatology for osteoporosis.

## 2016-02-14 NOTE — Progress Notes (Signed)
Spoke to patient.  She stated she would like to schedule her own mammogram due to patients schedule.

## 2016-02-14 NOTE — Progress Notes (Signed)
Subjective:    Patient ID: Chelbie Jarnagin, female    DOB: 05/08/1936, 80 y.o.   MRN: [id number]  CC: Jeanna Giuffre is a 80 y.o. female who presents today for physical exam.    HPI: Here for follow up and cpe; last CPE 2016.   She also complains of rash surrounding right eye, 3-4 weeks. Describes as flaky. No itching or pain.  No history of eczema, seasonal allergies, psoriasis. Was seen by dermatology and prescribed a topical cream, desnex, which worked initially, however not working well now. Has changed her shampoo. Has purchased new eye makeup. No fever, chills, changes in vision.    Specialists: Rheumatologist, dermatology.  Colorectal Cancer Screening: Last one 2008, normal per patient. A couple of polpys. Due 2018. Prefers to do DNA test at home. No family h/o of colon cancer.  Breast Cancer Screening: Mammogram Due; last normal 2016; h/o breast biopsy. Cervical Cancer Screening: Over 65, no h/o GYN cancer. No h/o abnormal cancer. Decided to stop screening.  Bone Health screening/DEXA for 65+: Last 2014 showing OP. On prolia. Had previously been on Fosamax, then tried Evista. Due for DEXA 2018.  Immunizations       Tetanus - UTD        Pneumococcal - Done Labs: Screening labs today. Exercise: Gets regular exercise.  Alcohol use: Occasional Smoking/tobacco use: Nonsmoker.  Regular dental exams: UTD Wears seat belt: Yes.  HISTORY:  Past Medical History:  Diagnosis Date  . History of colon polyps   . Hyperlipidemia   . Osteoporosis     Past Surgical History:  Procedure Laterality Date  . BREAST BIOPSY  2007   CORE W/CLIP - NEG   Family History  Problem Relation Age of Onset  . Arthritis Mother   . Alcohol abuse Father   . Heart disease Father 66    CAD - died  . Hyperlipidemia Brother   . Diabetes Brother   . Hypertension Brother   . Diabetes Brother   . Hyperlipidemia Brother   . Breast cancer Neg Hx       ALLERGIES: Review of patient's allergies  indicates no known allergies.  Current Outpatient Prescriptions on File Prior to Visit  Medication Sig Dispense Refill  . aspirin 81 MG tablet Take 81 mg by mouth daily.    Marland Kitchen BIOTIN 5000 PO Take 1 tablet by mouth daily.    . Calcium Citrate-Vitamin D (CAL-CITRATE PLUS VITAMIN D PO) Take 1 tablet by mouth daily.    Marland Kitchen denosumab (PROLIA) 60 MG/ML SOLN injection Inject 60 mg into the skin every 6 (six) months. Administer in upper arm, thigh, or abdomen    . Multiple Vitamin (MULTIVITAMIN) tablet Take 1 tablet by mouth daily.    . simvastatin (ZOCOR) 10 MG tablet Take 1 tablet (10 mg total) by mouth daily. MUST KEEP APT FOR FURTHER REFILLS 90 tablet 0   No current facility-administered medications on file prior to visit.     Social History  Substance Use Topics  . Smoking status: Never Smoker  . Smokeless tobacco: Never Used  . Alcohol use Yes     Comment: Occasional wine with dinner    Review of Systems  Constitutional: Negative for chills, fever and unexpected weight change.  HENT: Negative for congestion.   Eyes: Negative for pain, discharge and visual disturbance.  Respiratory: Negative for cough.   Cardiovascular: Negative for chest pain, palpitations and leg swelling.  Gastrointestinal: Negative for nausea and vomiting.  Musculoskeletal: Negative for arthralgias and  myalgias.  Skin: Positive for rash.  Neurological: Negative for headaches.  Hematological: Negative for adenopathy.  Psychiatric/Behavioral: Negative for confusion.      Objective:    BP 126/66 (BP Location: Right Arm, Patient Position: Sitting, Cuff Size: Normal)   Pulse 67   Temp 97.8 F (36.6 C) (Oral)   Ht 4' 11.5" (1.511 m)   Wt 112 lb (50.8 kg)   SpO2 97%   BMI 22.24 kg/m   BP Readings from Last 3 Encounters:  02/14/16 126/66  05/04/15 122/68  12/01/14 122/60   Wt Readings from Last 3 Encounters:  02/14/16 112 lb (50.8 kg)  05/04/15 118 lb 9.6 oz (53.8 kg)  12/01/14 118 lb (53.5 kg)     Physical Exam  Constitutional: She appears well-developed and well-nourished.  Eyes: Conjunctivae are normal.    Cardiovascular: Normal rate, regular rhythm, normal heart sounds and normal pulses.   Pulmonary/Chest: Effort normal and breath sounds normal. She has no wheezes. She has no rhonchi. She has no rales. Right breast exhibits no inverted nipple, no mass, no nipple discharge, no skin change and no tenderness. Left breast exhibits no inverted nipple, no mass, no nipple discharge, no skin change and no tenderness. Breasts are symmetrical.  Neurological: She is alert.  Skin: Skin is warm and dry.  Psychiatric: She has a normal mood and affect. Her speech is normal and behavior is normal. Thought content normal.  Vitals reviewed.      Assessment & Plan:   Problem List Items Addressed This Visit      Musculoskeletal and Integument   Osteoporosis    On Prolia and follows with Rheumatology for osteoporosis. Due for DEXA 2018.      Rash and nonspecific skin eruption    Symptoms c/w       Relevant Medications   triamcinolone (KENALOG) 0.025 % cream     Other   HLD (hyperlipidemia)    Pending lipid panel. On Zocor.      Routine general medical examination at a health care facility - Primary    Due for mammogram- ordered. No longer doing Pap smear or pelvic exam. Due for colonoscopy 2018. Discussed Cologuard versus colonoscopy. Patient stated she would call with decision as to which one and would go ahead an place order. Immunizations UTD. Screening labs ordered. On Prolia and follows with Rheumatology for osteoporosis.      Relevant Orders   CBC with Differential/Platelet   Comprehensive metabolic panel   Hemoglobin A1c   Lipid panel   TSH   VITAMIN D 25 Hydroxy (Vit-D Deficiency, Fractures)   MM DIGITAL SCREENING BILATERAL    Other Visit Diagnoses   None.      I am having Ms. Freyre start on triamcinolone. I am also having her maintain her denosumab,  multivitamin, Calcium Citrate-Vitamin D (CAL-CITRATE PLUS VITAMIN D PO), BIOTIN 5000 PO, aspirin, and simvastatin.   Meds ordered this encounter  Medications  . triamcinolone (KENALOG) 0.025 % cream    Sig: Apply 1 application topically 2 (two) times daily.    Dispense:  15 g    Refill:  0    Order Specific Question:   Supervising Provider    Answer:   Tresa Garter [[id number]]    Return precautions given.   Risks, benefits, and alternatives of the medications and treatment plan prescribed today were discussed, and patient expressed understanding.   Education regarding symptom management and diagnosis given to patient on AVS.   Continue to follow with  [name], FNP for routine health maintenance.   Thelma Comp and I agreed with plan.   [name], FNP

## 2016-02-14 NOTE — Patient Instructions (Signed)
It's been a pleasure meeting you. Please try Vaseline on rash first and then you may try topical corticosteroid on your face however only do this for a couple of days to see if works as it may cause discoloration of your skin.  If this does not work, I advise you to follow back up with dermatology.  Menopause is a normal process in which your reproductive ability comes to an end. This process happens gradually over a span of months to years, usually between the ages of 38 and 49. Menopause is complete when you have missed 12 consecutive menstrual periods. It is important to talk with your health care provider about some of the most common conditions that affect postmenopausal women, such as heart disease, cancer, and bone loss (osteoporosis). Adopting a healthy lifestyle and getting preventive care can help to promote your health and wellness. Those actions can also lower your chances of developing some of these common conditions. WHAT SHOULD I KNOW ABOUT MENOPAUSE? During menopause, you may experience a number of symptoms, such as:  Moderate-to-severe hot flashes.  Night sweats.  Decrease in sex drive.  Mood swings.  Headaches.  Tiredness.  Irritability.  Memory problems.  Insomnia. Choosing to treat or not to treat menopausal changes is an individual decision that you make with your health care provider. WHAT SHOULD I KNOW ABOUT HORMONE REPLACEMENT THERAPY AND SUPPLEMENTS? Hormone therapy products are effective for treating symptoms that are associated with menopause, such as hot flashes and night sweats. Hormone replacement carries certain risks, especially as you become older. If you are thinking about using estrogen or estrogen with progestin treatments, discuss the benefits and risks with your health care provider. WHAT SHOULD I KNOW ABOUT HEART DISEASE AND STROKE? Heart disease, heart attack, and stroke become more likely as you age. This may be due, in part, to the hormonal  changes that your body experiences during menopause. These can affect how your body processes dietary fats, triglycerides, and cholesterol. Heart attack and stroke are both medical emergencies. There are many things that you can do to help prevent heart disease and stroke:  Have your blood pressure checked at least every 1-2 years. High blood pressure causes heart disease and increases the risk of stroke.  If you are 49-94 years old, ask your health care provider if you should take aspirin to prevent a heart attack or a stroke.  Do not use any tobacco products, including cigarettes, chewing tobacco, or electronic cigarettes. If you need help quitting, ask your health care provider.  It is important to eat a healthy diet and maintain a healthy weight.  Be sure to include plenty of vegetables, fruits, low-fat dairy products, and lean protein.  Avoid eating foods that are high in solid fats, added sugars, or salt (sodium).  Get regular exercise. This is one of the most important things that you can do for your health.  Try to exercise for at least 150 minutes each week. The type of exercise that you do should increase your heart rate and make you sweat. This is known as moderate-intensity exercise.  Try to do strengthening exercises at least twice each week. Do these in addition to the moderate-intensity exercise.  Know your numbers.Ask your health care provider to check your cholesterol and your blood glucose. Continue to have your blood tested as directed by your health care provider. WHAT SHOULD I KNOW ABOUT CANCER SCREENING? There are several types of cancer. Take the following steps to reduce your risk and  to catch any cancer development as early as possible. Breast Cancer  Practice breast self-awareness.  This means understanding how your breasts normally appear and feel.  It also means doing regular breast self-exams. Let your health care provider know about any changes, no matter  how small.  If you are 33 or older, have a clinician do a breast exam (clinical breast exam or CBE) every year. Depending on your age, family history, and medical history, it may be recommended that you also have a yearly breast X-ray (mammogram).  If you have a family history of breast cancer, talk with your health care provider about genetic screening.  If you are at high risk for breast cancer, talk with your health care provider about having an MRI and a mammogram every year.  Breast cancer (BRCA) gene test is recommended for women who have family members with BRCA-related cancers. Results of the assessment will determine the need for genetic counseling and BRCA1 and for BRCA2 testing. BRCA-related cancers include these types:  Breast. This occurs in males or females.  Ovarian.  Tubal. This may also be called fallopian tube cancer.  Cancer of the abdominal or pelvic lining (peritoneal cancer).  Prostate.  Pancreatic. Cervical, Uterine, and Ovarian Cancer Your health care provider may recommend that you be screened regularly for cancer of the pelvic organs. These include your ovaries, uterus, and vagina. This screening involves a pelvic exam, which includes checking for microscopic changes to the surface of your cervix (Pap test).  For women ages 21-65, health care providers may recommend a pelvic exam and a Pap test every three years. For women ages 63-65, they may recommend the Pap test and pelvic exam, combined with testing for human papilloma virus (HPV), every five years. Some types of HPV increase your risk of cervical cancer. Testing for HPV may also be done on women of any age who have unclear Pap test results.  Other health care providers may not recommend any screening for nonpregnant women who are considered low risk for pelvic cancer and have no symptoms. Ask your health care provider if a screening pelvic exam is right for you.  If you have had past treatment for cervical  cancer or a condition that could lead to cancer, you need Pap tests and screening for cancer for at least 20 years after your treatment. If Pap tests have been discontinued for you, your risk factors (such as having a new sexual partner) need to be reassessed to determine if you should start having screenings again. Some women have medical problems that increase the chance of getting cervical cancer. In these cases, your health care provider may recommend that you have screening and Pap tests more often.  If you have a family history of uterine cancer or ovarian cancer, talk with your health care provider about genetic screening.  If you have vaginal bleeding after reaching menopause, tell your health care provider.  There are currently no reliable tests available to screen for ovarian cancer. Lung Cancer Lung cancer screening is recommended for adults 55-74 years old who are at high risk for lung cancer because of a history of smoking. A yearly low-dose CT scan of the lungs is recommended if you:  Currently smoke.  Have a history of at least 30 pack-years of smoking and you currently smoke or have quit within the past 15 years. A pack-year is smoking an average of one pack of cigarettes per day for one year. Yearly screening should:  Continue until  it has been 15 years since you quit.  Stop if you develop a health problem that would prevent you from having lung cancer treatment. Colorectal Cancer  This type of cancer can be detected and can often be prevented.  Routine colorectal cancer screening usually begins at age 50 and continues through age 80.  If you have risk factors for colon cancer, your health care provider may recommend that you be screened at an earlier age.  If you have a family history of colorectal cancer, talk with your health care provider about genetic screening.  Your health care provider may also recommend using home test kits to check for hidden blood in your  stool.  A small camera at the end of a tube can be used to examine your colon directly (sigmoidoscopy or colonoscopy). This is done to check for the earliest forms of colorectal cancer.  Direct examination of the colon should be repeated every 5-10 years until age 75. However, if early forms of precancerous polyps or small growths are found or if you have a family history or genetic risk for colorectal cancer, you may need to be screened more often. Skin Cancer  Check your skin from head to toe regularly.  Monitor any moles. Be sure to tell your health care provider:  About any new moles or changes in moles, especially if there is a change in a mole's shape or color.  If you have a mole that is larger than the size of a pencil eraser.  If any of your family members has a history of skin cancer, especially at a young age, talk with your health care provider about genetic screening.  Always use sunscreen. Apply sunscreen liberally and repeatedly throughout the day.  Whenever you are outside, protect yourself by wearing long sleeves, pants, a wide-brimmed hat, and sunglasses. WHAT SHOULD I KNOW ABOUT OSTEOPOROSIS? Osteoporosis is a condition in which bone destruction happens more quickly than new bone creation. After menopause, you may be at an increased risk for osteoporosis. To help prevent osteoporosis or the bone fractures that can happen because of osteoporosis, the following is recommended:  If you are 64-29 years old, get at least 1,000 mg of calcium and at least 600 mg of vitamin D per day.  If you are older than age 85 but younger than age 70, get at least 1,200 mg of calcium and at least 600 mg of vitamin D per day.  If you are older than age 47, get at least 1,200 mg of calcium and at least 800 mg of vitamin D per day. Smoking and excessive alcohol intake increase the risk of osteoporosis. Eat foods that are rich in calcium and vitamin D, and do weight-bearing exercises several  times each week as directed by your health care provider. WHAT SHOULD I KNOW ABOUT HOW MENOPAUSE AFFECTS [place]? Depression may occur at any age, but it is more common as you become older. Common symptoms of depression include:  Low or sad mood.  Changes in sleep patterns.  Changes in appetite or eating patterns.  Feeling an overall lack of motivation or enjoyment of activities that you previously enjoyed.  Frequent crying spells. Talk with your health care provider if you think that you are experiencing depression. WHAT SHOULD I KNOW ABOUT IMMUNIZATIONS? It is important that you get and maintain your immunizations. These include:  Tetanus, diphtheria, and pertussis (Tdap) booster vaccine.  Influenza every year before the flu season begins.  Pneumonia vaccine.  Shingles vaccine. Your health care provider may also recommend other immunizations.   This information is not intended to replace advice given to you by your health care provider. Make sure you discuss any questions you have with your health care provider.   Document Released: 08/23/2005 Document Revised: 07/22/2014 Document Reviewed: 03/03/2014 Elsevier Interactive Patient Education [employer].

## 2016-02-14 NOTE — Assessment & Plan Note (Signed)
On Prolia and follows with Rheumatology for osteoporosis. Due for DEXA 2018.

## 2016-02-26 ENCOUNTER — Other Ambulatory Visit (INDEPENDENT_AMBULATORY_CARE_PROVIDER_SITE_OTHER): Payer: BLUE CROSS/BLUE SHIELD

## 2016-02-26 DIAGNOSIS — Z0001 Encounter for general adult medical examination with abnormal findings: Secondary | ICD-10-CM | POA: Diagnosis not present

## 2016-02-26 DIAGNOSIS — R6889 Other general symptoms and signs: Secondary | ICD-10-CM | POA: Diagnosis not present

## 2016-02-26 LAB — CBC WITH DIFFERENTIAL/PLATELET
BASOS ABS: 0 10*3/uL (ref 0.0–0.1)
Basophils Relative: 0.4 % (ref 0.0–3.0)
Eosinophils Absolute: 0.1 10*3/uL (ref 0.0–0.7)
Eosinophils Relative: 1.9 % (ref 0.0–5.0)
HEMATOCRIT: 42.8 % (ref 36.0–46.0)
Hemoglobin: 14.3 g/dL (ref 12.0–15.0)
Lymphocytes Relative: 27.1 % (ref 12.0–46.0)
Lymphs Abs: 1.2 10*3/uL (ref 0.7–4.0)
MCHC: 33.5 g/dL (ref 30.0–36.0)
MCV: 89.7 fl (ref 78.0–100.0)
MONOS PCT: 11.3 % (ref 3.0–12.0)
Monocytes Absolute: 0.5 10*3/uL (ref 0.1–1.0)
NEUTROS ABS: 2.6 10*3/uL (ref 1.4–7.7)
Neutrophils Relative %: 59.3 % (ref 43.0–77.0)
PLATELETS: 262 10*3/uL (ref 150.0–400.0)
RBC: 4.78 Mil/uL (ref 3.87–5.11)
RDW: 13.5 % (ref 11.5–15.5)
WBC: 4.3 10*3/uL (ref 4.0–10.5)

## 2016-02-26 LAB — COMPREHENSIVE METABOLIC PANEL
ALK PHOS: 29 U/L — AB (ref 39–117)
ALT: 21 U/L (ref 0–35)
AST: 24 U/L (ref 0–37)
Albumin: 4.4 g/dL (ref 3.5–5.2)
BUN: 12 mg/dL (ref 6–23)
CALCIUM: 9.4 mg/dL (ref 8.4–10.5)
CO2: 30 mEq/L (ref 19–32)
Chloride: 102 mEq/L (ref 96–112)
Creatinine, Ser: 0.66 mg/dL (ref 0.40–1.20)
GFR: 92.1 mL/min (ref >60.00)
Glucose, Bld: 100 mg/dL — ABNORMAL HIGH (ref 70–99)
Potassium: 4.5 mEq/L (ref 3.5–5.1)
Sodium: 140 mEq/L (ref 135–145)
TOTAL PROTEIN: 7.2 g/dL (ref 6.0–8.3)
Total Bilirubin: 0.6 mg/dL (ref 0.2–1.2)

## 2016-02-26 LAB — LIPID PANEL
CHOLESTEROL: 190 mg/dL (ref 0–200)
HDL: 76.7 mg/dL (ref >39.00)
LDL Cholesterol: 102 mg/dL — ABNORMAL HIGH (ref 0–99)
NonHDL: 113.31
TRIGLYCERIDES: 58 mg/dL (ref 0.0–149.0)
Total CHOL/HDL Ratio: 2
VLDL: 11.6 mg/dL (ref 0.0–40.0)

## 2016-02-26 LAB — TSH: TSH: 1.03 u[IU]/mL (ref 0.35–4.50)

## 2016-02-26 LAB — HEMOGLOBIN A1C: Hgb A1c MFr Bld: 5.9 % (ref 4.6–6.5)

## 2016-02-26 LAB — VITAMIN D 25 HYDROXY (VIT D DEFICIENCY, FRACTURES): VITD: 53.5 ng/mL (ref 30.00–100.00)

## 2016-03-19 ENCOUNTER — Encounter: Payer: Self-pay | Admitting: Family

## 2016-04-23 ENCOUNTER — Other Ambulatory Visit: Payer: Self-pay | Admitting: Family Medicine

## 2016-05-03 ENCOUNTER — Ambulatory Visit: Payer: PRIVATE HEALTH INSURANCE

## 2016-05-23 ENCOUNTER — Other Ambulatory Visit: Payer: Self-pay | Admitting: Family

## 2016-05-23 ENCOUNTER — Ambulatory Visit
Admission: RE | Admit: 2016-05-23 | Discharge: 2016-05-23 | Disposition: A | Discharge status: Home / Self Care | Payer: Medicare Other | Source: Ambulatory Visit | Attending: Family | Admitting: Family

## 2016-05-23 DIAGNOSIS — Z1231 Encounter for screening mammogram for malignant neoplasm of breast: Secondary | ICD-10-CM | POA: Diagnosis present

## 2016-05-23 DIAGNOSIS — Z0001 Encounter for general adult medical examination with abnormal findings: Secondary | ICD-10-CM

## 2016-06-23 IMAGING — MG MM DIGITAL SCREENING BILATERAL
4 series · 4 of 4 positions shown · non-contrast
Comparison: Previous exam(s).

CLINICAL DATA: Screening.

EXAM:
DIGITAL SCREENING BILATERAL MAMMOGRAM WITH CAD

[L CC]
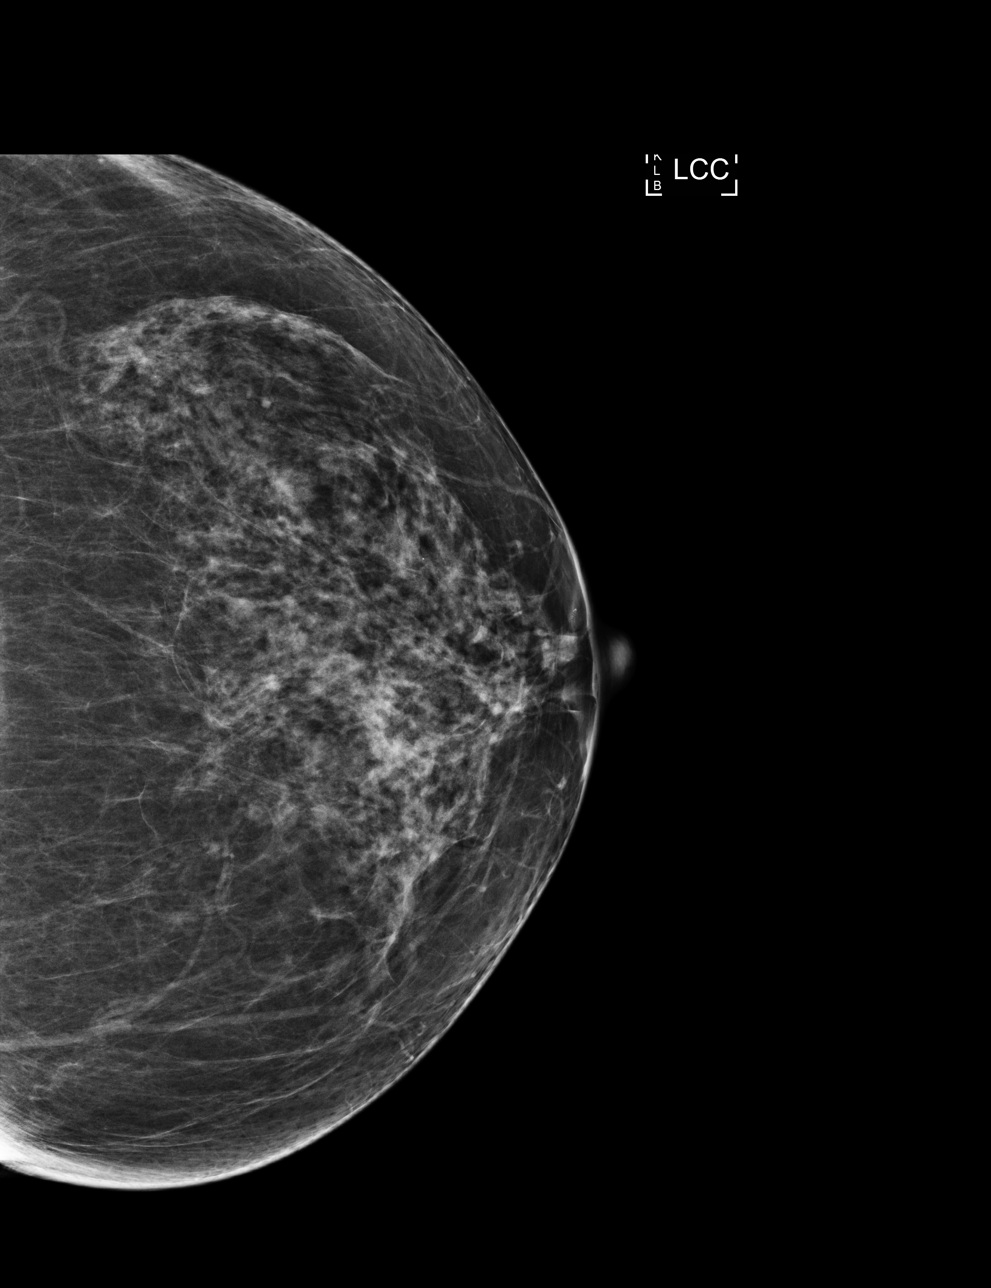

[R MLO]
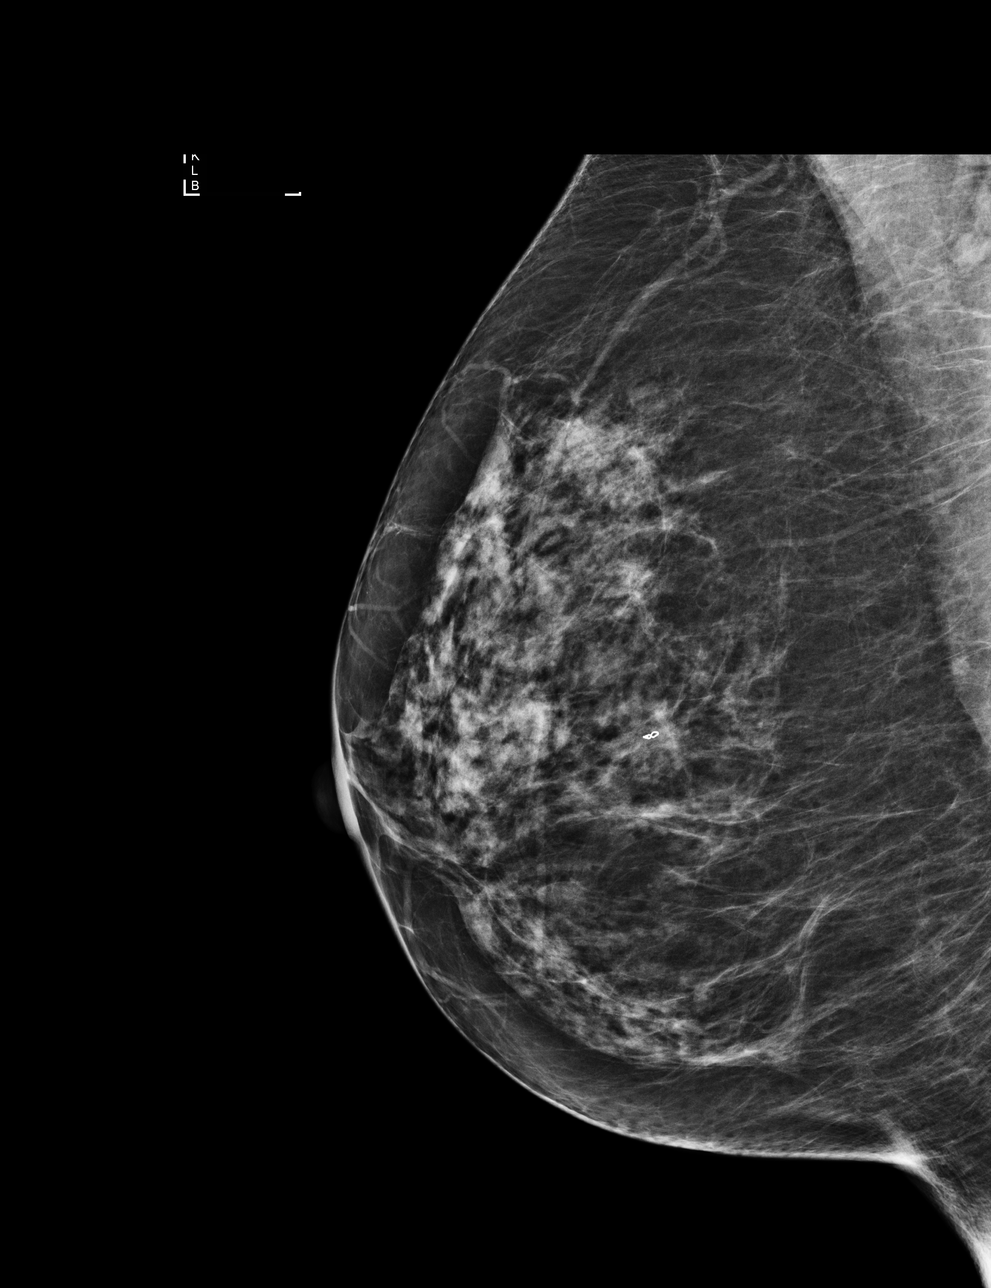

[R CC]
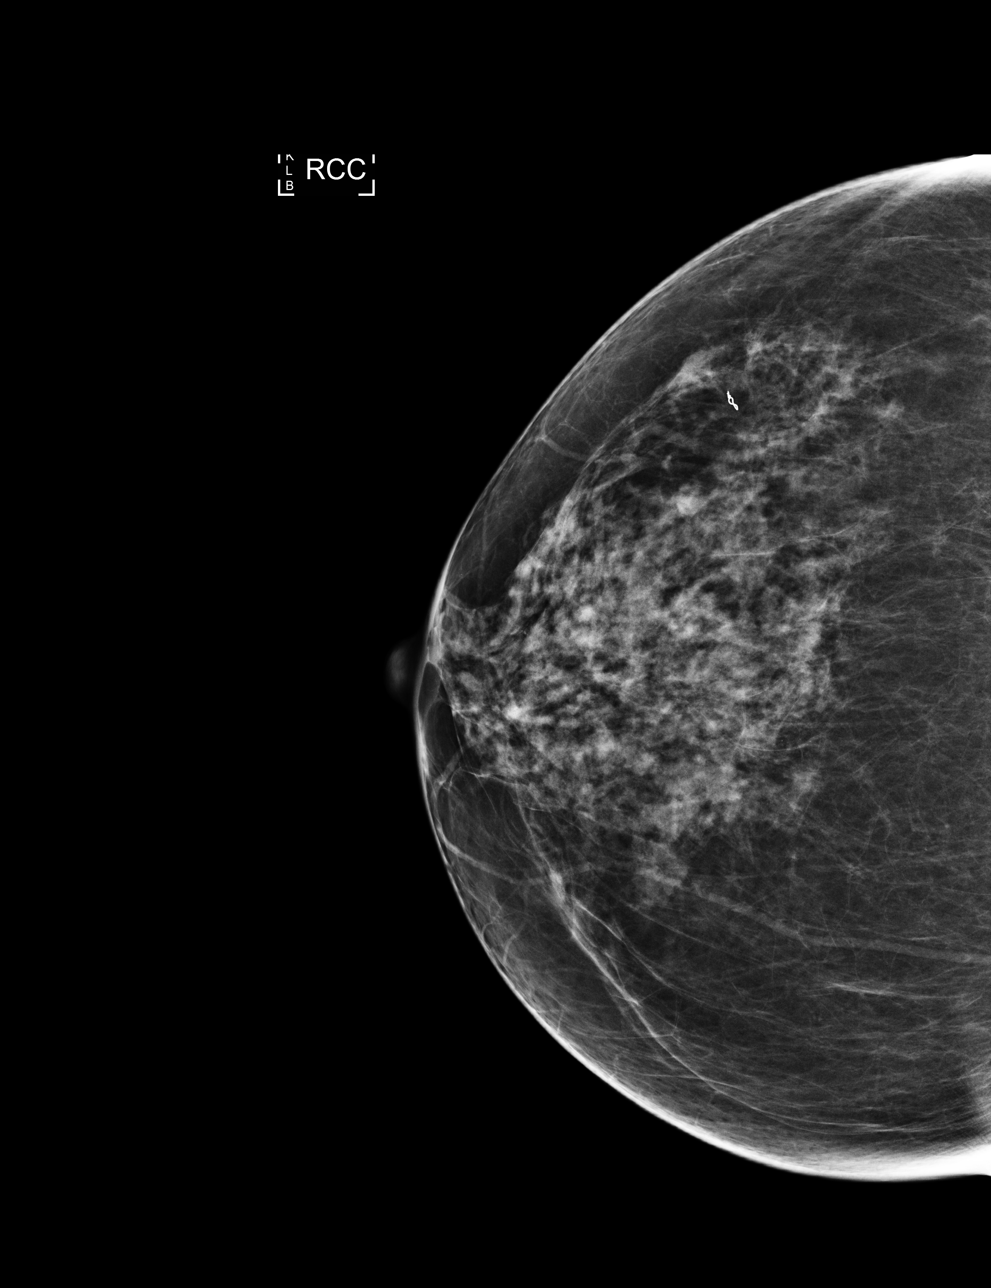

[L MLO]
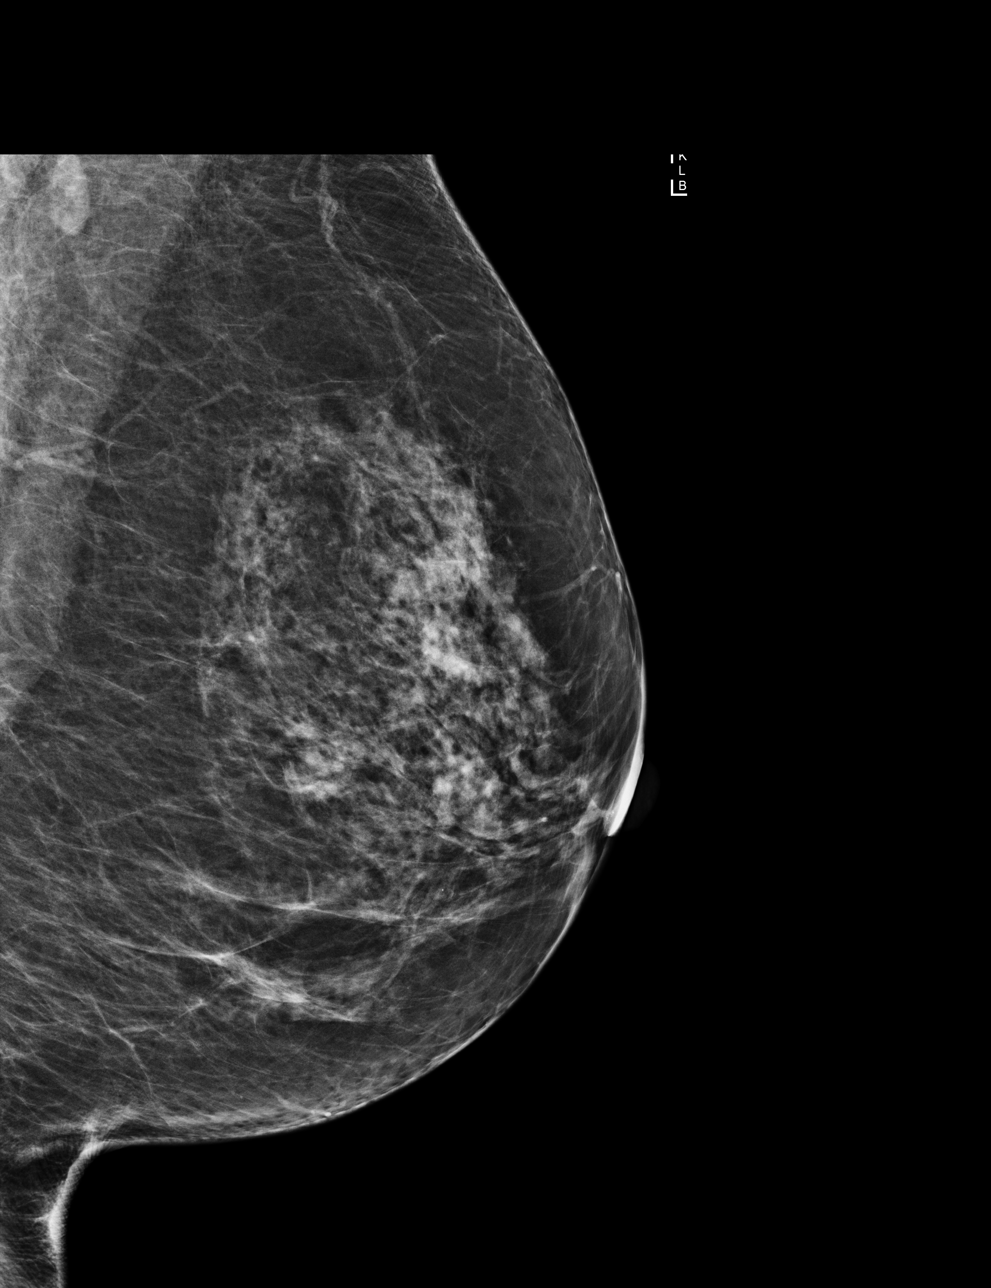

[4 of 4 positions shown; findings below may reference images not displayed]

ACR Breast Density Category c: The breast tissue is heterogeneously
dense, which may obscure small masses.
FINDINGS: There are no findings suspicious for malignancy. Images were
processed with CAD.
IMPRESSION: No mammographic evidence of malignancy. A result letter of this
screening mammogram will be mailed directly to the patient.

RECOMMENDATION:
Screening mammogram in one year. (Code:[id number])

BI-RADS CATEGORY  1: Negative.

## 2016-08-22 ENCOUNTER — Ambulatory Visit: Payer: PRIVATE HEALTH INSURANCE | Admitting: Family

## 2016-08-22 ENCOUNTER — Ambulatory Visit: Payer: PRIVATE HEALTH INSURANCE | Admitting: Internal Medicine

## 2016-12-02 ENCOUNTER — Other Ambulatory Visit: Payer: Self-pay | Admitting: Family

## 2017-01-21 ENCOUNTER — Other Ambulatory Visit: Payer: Self-pay | Admitting: Rheumatology

## 2017-01-21 DIAGNOSIS — M810 Age-related osteoporosis without current pathological fracture: Secondary | ICD-10-CM

## 2017-01-28 ENCOUNTER — Ambulatory Visit
Admission: RE | Admit: 2017-01-28 | Discharge: 2017-01-28 | Disposition: A | Discharge status: Home / Self Care | Payer: BLUE CROSS/BLUE SHIELD | Source: Ambulatory Visit | Attending: Rheumatology | Admitting: Rheumatology

## 2017-01-28 DIAGNOSIS — M810 Age-related osteoporosis without current pathological fracture: Secondary | ICD-10-CM | POA: Diagnosis not present

## 2017-01-28 DIAGNOSIS — Z780 Asymptomatic menopausal state: Secondary | ICD-10-CM | POA: Diagnosis not present

## 2017-01-28 DIAGNOSIS — Z13820 Encounter for screening for osteoporosis: Secondary | ICD-10-CM | POA: Diagnosis not present

## 2017-05-05 ENCOUNTER — Telehealth: Payer: Self-pay | Admitting: Family

## 2017-05-05 ENCOUNTER — Other Ambulatory Visit: Payer: Self-pay | Admitting: Family

## 2017-05-05 DIAGNOSIS — Z0000 Encounter for general adult medical examination without abnormal findings: Secondary | ICD-10-CM

## 2017-05-05 NOTE — Telephone Encounter (Signed)
Please advise 

## 2017-05-05 NOTE — Telephone Encounter (Signed)
Pt called about needing a order to get labs done before her appt please and thank you!

## 2017-05-06 NOTE — Telephone Encounter (Signed)
Ok. Thank you.

## 2017-05-06 NOTE — Telephone Encounter (Signed)
Labs ordered. Let patient know

## 2017-05-13 ENCOUNTER — Other Ambulatory Visit (INDEPENDENT_AMBULATORY_CARE_PROVIDER_SITE_OTHER): Payer: BLUE CROSS/BLUE SHIELD

## 2017-05-13 DIAGNOSIS — Z0000 Encounter for general adult medical examination without abnormal findings: Secondary | ICD-10-CM | POA: Diagnosis not present

## 2017-05-13 LAB — LIPID PANEL
CHOL/HDL RATIO: 3
Cholesterol: 209 mg/dL — ABNORMAL HIGH (ref 0–200)
HDL: 76.8 mg/dL (ref >39.00)
LDL Cholesterol: 112 mg/dL — ABNORMAL HIGH (ref 0–99)
NonHDL: 131.9
Triglycerides: 98 mg/dL (ref 0.0–149.0)
VLDL: 19.6 mg/dL (ref 0.0–40.0)

## 2017-05-13 LAB — CBC WITH DIFFERENTIAL/PLATELET
Basophils Absolute: 0 10*3/uL (ref 0.0–0.1)
Basophils Relative: 0.7 % (ref 0.0–3.0)
EOS PCT: 2.9 % (ref 0.0–5.0)
Eosinophils Absolute: 0.1 10*3/uL (ref 0.0–0.7)
HCT: 44.5 % (ref 36.0–46.0)
Hemoglobin: 14.5 g/dL (ref 12.0–15.0)
LYMPHS ABS: 1.2 10*3/uL (ref 0.7–4.0)
Lymphocytes Relative: 27.8 % (ref 12.0–46.0)
MCHC: 32.5 g/dL (ref 30.0–36.0)
MCV: 93.3 fl (ref 78.0–100.0)
MONOS PCT: 11.2 % (ref 3.0–12.0)
Monocytes Absolute: 0.5 10*3/uL (ref 0.1–1.0)
NEUTROS ABS: 2.4 10*3/uL (ref 1.4–7.7)
NEUTROS PCT: 57.4 % (ref 43.0–77.0)
PLATELETS: 297 10*3/uL (ref 150.0–400.0)
RBC: 4.77 Mil/uL (ref 3.87–5.11)
RDW: 13.4 % (ref 11.5–15.5)
WBC: 4.2 10*3/uL (ref 4.0–10.5)

## 2017-05-13 LAB — COMPREHENSIVE METABOLIC PANEL
ALK PHOS: 34 U/L — AB (ref 39–117)
ALT: 19 U/L (ref 0–35)
AST: 22 U/L (ref 0–37)
Albumin: 4.5 g/dL (ref 3.5–5.2)
BUN: 12 mg/dL (ref 6–23)
CHLORIDE: 101 meq/L (ref 96–112)
CO2: 30 mEq/L (ref 19–32)
Calcium: 9.4 mg/dL (ref 8.4–10.5)
Creatinine, Ser: 0.56 mg/dL (ref 0.40–1.20)
GFR: 110.98 mL/min (ref >60.00)
GLUCOSE: 97 mg/dL (ref 70–99)
Potassium: 3.9 mEq/L (ref 3.5–5.1)
Sodium: 140 mEq/L (ref 135–145)
TOTAL PROTEIN: 7.4 g/dL (ref 6.0–8.3)
Total Bilirubin: 0.6 mg/dL (ref 0.2–1.2)

## 2017-05-13 LAB — VITAMIN D 25 HYDROXY (VIT D DEFICIENCY, FRACTURES): VITD: 49.52 ng/mL (ref 30.00–100.00)

## 2017-05-13 LAB — TSH: TSH: 1.63 u[IU]/mL (ref 0.35–4.50)

## 2017-05-13 LAB — HEMOGLOBIN A1C: Hgb A1c MFr Bld: 5.9 % (ref 4.6–6.5)

## 2017-05-14 ENCOUNTER — Encounter: Payer: Self-pay | Admitting: Family

## 2017-05-14 ENCOUNTER — Ambulatory Visit (INDEPENDENT_AMBULATORY_CARE_PROVIDER_SITE_OTHER): Payer: BLUE CROSS/BLUE SHIELD | Admitting: Family

## 2017-05-14 VITALS — BP 126/62 | HR 89 | Temp 97.7°F | Ht 59.5 in | Wt 119.4 lb

## 2017-05-14 DIAGNOSIS — Z1231 Encounter for screening mammogram for malignant neoplasm of breast: Secondary | ICD-10-CM

## 2017-05-14 DIAGNOSIS — Z1239 Encounter for other screening for malignant neoplasm of breast: Secondary | ICD-10-CM

## 2017-05-14 DIAGNOSIS — E785 Hyperlipidemia, unspecified: Secondary | ICD-10-CM | POA: Diagnosis not present

## 2017-05-14 DIAGNOSIS — M810 Age-related osteoporosis without current pathological fracture: Secondary | ICD-10-CM | POA: Diagnosis not present

## 2017-05-14 NOTE — Patient Instructions (Signed)
Take zocor regualarly wnad we will see if if LDL comes down  Let me know about colonoscopy and if I can order for you  We placed a referral for mammogram this year. I asked that you call one the below locations and schedule this when it is convenient for you.   As discussed, I would like you to ask for 3D mammogram over the traditional 2D mammogram as new evidence suggest 3D is superior.   Please note that NOT all insurance companies cover 3D and you may have to pay a higher copay. You may call your insurance company to further clarify your benefits.   Options for Mammogram.    [facility]  [address]  [place], [place]  [phone]  * [facility]   [facility] [address] [place], [place] [phone] * Note if you ask for 3D mammogram at this location, you must request [place], [place] location*

## 2017-05-14 NOTE — Progress Notes (Signed)
Pre visit review using our clinic review tool, if applicable. No additional management support is needed unless otherwise documented below in the visit note. 

## 2017-05-14 NOTE — Progress Notes (Signed)
Subjective:    Patient ID: Janet Shields, female    DOB: 1936/05/13, 81 y.o.   MRN: [id number]  CC: Conita Amenta is a 81 y.o. female who presents today for follow up.   HPI: Here for follow-up for lab results.  No complaints today. Feels well.   HLD - not taking zocor regularly. Forgets to take.   Osteoporosis- follows with dr Jessee Avers, on prolia; DEXA repeated in 2018  Colonoscopy ( h/o polyp 2007) and mammogram Due      HISTORY:  Past Medical History:  Diagnosis Date  . History of colon polyps   . Hyperlipidemia   . Osteoporosis    Past Surgical History:  Procedure Laterality Date  . BREAST BIOPSY  2007   CORE W/CLIP - NEG   Family History  Problem Relation Age of Onset  . Arthritis Mother   . Alcohol abuse Father   . Heart disease Father 40       CAD - died  . Hyperlipidemia Brother   . Diabetes Brother   . Hypertension Brother   . Diabetes Brother   . Hyperlipidemia Brother   . Breast cancer Neg Hx     Allergies: Patient has no known allergies. Current Outpatient Prescriptions on File Prior to Visit  Medication Sig Dispense Refill  . aspirin 81 MG tablet Take 81 mg by mouth daily.    Marland Kitchen BIOTIN 5000 PO Take 1 tablet by mouth daily.    . Calcium Citrate-Vitamin D (CAL-CITRATE PLUS VITAMIN D PO) Take 1 tablet by mouth daily.    Marland Kitchen denosumab (PROLIA) 60 MG/ML SOLN injection Inject 60 mg into the skin every 6 (six) months. Administer in upper arm, thigh, or abdomen    . Multiple Vitamin (MULTIVITAMIN) tablet Take 1 tablet by mouth daily.    . simvastatin (ZOCOR) 10 MG tablet TAKE 1 TABLET BY MOUTH DAILY 90 tablet 1  . triamcinolone (KENALOG) 0.025 % cream Apply 1 application topically 2 (two) times daily. 15 g 0   No current facility-administered medications on file prior to visit.     Social History  Substance Use Topics  . Smoking status: Never Smoker  . Smokeless tobacco: Never Used  . Alcohol use Yes     Comment: Occasional wine with dinner     Review of Systems  Constitutional: Negative for chills and fever.  Respiratory: Negative for cough.   Cardiovascular: Negative for chest pain and palpitations.  Gastrointestinal: Negative for nausea and vomiting.      Objective:    BP 126/62   Pulse 89   Temp 97.7 F (36.5 C) (Oral)   Ht 4' 11.5" (1.511 m)   Wt 119 lb 6.4 oz (54.2 kg)   SpO2 98%   BMI 23.71 kg/m  BP Readings from Last 3 Encounters:  05/14/17 126/62  02/14/16 126/66  05/04/15 122/68   Wt Readings from Last 3 Encounters:  05/14/17 119 lb 6.4 oz (54.2 kg)  02/14/16 112 lb (50.8 kg)  05/04/15 118 lb 9.6 oz (53.8 kg)    Physical Exam  Constitutional: She appears well-developed and well-nourished.  Eyes: Conjunctivae are normal.  Cardiovascular: Normal rate, regular rhythm, normal heart sounds and normal pulses.   Pulmonary/Chest: Effort normal and breath sounds normal. She has no wheezes. She has no rhonchi. She has no rales.  Neurological: She is alert.  Skin: Skin is warm and dry.  Psychiatric: She has a normal mood and affect. Her speech is normal and behavior is normal. Thought content  normal.  Vitals reviewed.      Assessment & Plan:   Problem List Items Addressed This Visit      Musculoskeletal and Integument   Osteoporosis    Currently on prolia. Modest improvement. Following with rheumatology. Will follow.         Other   HLD (hyperlipidemia) - Primary    Discussed increasing  Zocor, and this time patient would like to stay on current dose and will aim to take more regularly.  She will incorporate lifestyle measures. Will follow      Screening for breast cancer    Due; patient understands to schedule      Relevant Orders   MM SCREENING BREAST TOMO BILATERAL       I am having Ms. Banning maintain her denosumab, multivitamin, Calcium Citrate-Vitamin D (CAL-CITRATE PLUS VITAMIN D PO), BIOTIN 5000 PO, aspirin, triamcinolone, and simvastatin.   No orders of the defined types  were placed in this encounter.   Return precautions given.   Risks, benefits, and alternatives of the medications and treatment plan prescribed today were discussed, and patient expressed understanding.   Education regarding symptom management and diagnosis given to patient on AVS.  Continue to follow with Allegra GranaArnett, Jessalynn Mccowan G, FNP for routine health maintenance.   Thelma CompBeverly Birdsall and I agreed with plan.   Rennie PlowmanMargaret Aristidis Talerico, FNP

## 2017-05-15 NOTE — Assessment & Plan Note (Addendum)
Discussed increasing  Zocor, and this time patient would like to stay on current dose and will aim to take more regularly.  She will incorporate lifestyle measures. Will follow

## 2017-05-15 NOTE — Assessment & Plan Note (Signed)
Currently on prolia. Modest improvement. Following with rheumatology. Will follow.

## 2017-05-15 NOTE — Assessment & Plan Note (Signed)
Due; patient understands to schedule

## 2017-05-28 ENCOUNTER — Ambulatory Visit
Admission: RE | Admit: 2017-05-28 | Discharge: 2017-05-28 | Disposition: A | Discharge status: Home / Self Care | Payer: BLUE CROSS/BLUE SHIELD | Source: Ambulatory Visit | Attending: Family | Admitting: Family

## 2017-05-28 DIAGNOSIS — Z1231 Encounter for screening mammogram for malignant neoplasm of breast: Secondary | ICD-10-CM | POA: Insufficient documentation

## 2017-05-28 DIAGNOSIS — Z1239 Encounter for other screening for malignant neoplasm of breast: Secondary | ICD-10-CM

## 2017-10-17 ENCOUNTER — Ambulatory Visit: Payer: Self-pay | Admitting: *Deleted

## 2017-10-17 ENCOUNTER — Emergency Department: Payer: Medicare Other

## 2017-10-17 ENCOUNTER — Emergency Department
Admission: EM | Admit: 2017-10-17 | Discharge: 2017-10-17 | Disposition: A | Discharge status: Home / Self Care | Payer: Medicare Other | Attending: Emergency Medicine | Admitting: Emergency Medicine

## 2017-10-17 ENCOUNTER — Other Ambulatory Visit: Payer: Self-pay

## 2017-10-17 ENCOUNTER — Encounter: Payer: Self-pay | Admitting: Emergency Medicine

## 2017-10-17 DIAGNOSIS — R079 Chest pain, unspecified: Secondary | ICD-10-CM | POA: Diagnosis present

## 2017-10-17 DIAGNOSIS — Z79899 Other long term (current) drug therapy: Secondary | ICD-10-CM | POA: Insufficient documentation

## 2017-10-17 DIAGNOSIS — Z7982 Long term (current) use of aspirin: Secondary | ICD-10-CM | POA: Diagnosis not present

## 2017-10-17 DIAGNOSIS — R0789 Other chest pain: Secondary | ICD-10-CM | POA: Insufficient documentation

## 2017-10-17 LAB — CBC
HCT: 44 % (ref 35.0–47.0)
Hemoglobin: 14.7 g/dL (ref 12.0–16.0)
MCH: 30.2 pg (ref 26.0–34.0)
MCHC: 33.4 g/dL (ref 32.0–36.0)
MCV: 90.5 fL (ref 80.0–100.0)
Platelets: 255 10*3/uL (ref 150–440)
RBC: 4.86 MIL/uL (ref 3.80–5.20)
RDW: 13.7 % (ref 11.5–14.5)
WBC: 3.8 10*3/uL (ref 3.6–11.0)

## 2017-10-17 LAB — BASIC METABOLIC PANEL
Anion gap: 9 (ref 5–15)
BUN: 18 mg/dL (ref 6–20)
CHLORIDE: 106 mmol/L (ref 101–111)
CO2: 26 mmol/L (ref 22–32)
CREATININE: 0.55 mg/dL (ref 0.44–1.00)
Calcium: 9.2 mg/dL (ref 8.9–10.3)
GFR calc Af Amer: >60 mL/min (ref >60)
Glucose, Bld: 135 mg/dL — ABNORMAL HIGH (ref 65–99)
Potassium: 3.6 mmol/L (ref 3.5–5.1)
SODIUM: 141 mmol/L (ref 135–145)

## 2017-10-17 LAB — TROPONIN I
Troponin I: <0.03 ng/mL (ref <0.03)
Troponin I: <0.03 ng/mL (ref <0.03)

## 2017-10-17 MED ORDER — SODIUM CHLORIDE 0.9 % IV BOLUS
500.0000 mL | Freq: Once | INTRAVENOUS | Status: AC
  Administered 2017-10-17: 500 mL via INTRAVENOUS

## 2017-10-17 MED ORDER — ACETAMINOPHEN 325 MG PO TABS
650.0000 mg | ORAL_TABLET | Freq: Once | ORAL | Status: AC
  Administered 2017-10-17: 650 mg via ORAL
  Filled 2017-10-17: qty 2

## 2017-10-17 MED ORDER — IOHEXOL 350 MG/ML SOLN
75.0000 mL | Freq: Once | INTRAVENOUS | Status: AC | PRN
  Administered 2017-10-17: 75 mL via INTRAVENOUS

## 2017-10-17 NOTE — ED Provider Notes (Addendum)
[facility], [place] Emergency Department Provider Note  ____________________________________________   I have reviewed the triage vital signs and the nursing notes. Where available I have reviewed prior notes and, if possible and indicated, outside hospital notes.    HISTORY  Chief Complaint Chest Pain    HPI Janet Shields is a 82 y.o. female  with a history of hyperlipidemia but no other known risk factors for CAD aside from her brother, presents today complaining of a very limited band of discomfort that starts in her spine and works its way around in a dermatomal-like distribution as she indicates on her chest, around underneath her left breast and to the midline of her chest does not past the midline.  There is no rashes.  She did get her shingles shot.  There is no chest pain otherwise, that is to say there is no exertional chest pain there is no pleuritic chest pain nothing makes it better except for ibuprofen which gets it down but does not make it completely go away, nothing makes it worse than it is not positional or exertional, she has no exertional symptoms of any variety.  She has had this discomfort now nonstop for 24 hours and started however Tuesday morning gradually this is Friday.  She states that she has had no fever no chills no cough.  She has no personal or family history of PE or DVT she is not on estrogen she said no recent surgery she has no leg swelling, the pain is nonpleuritic and she has no pulmonary or pleuritic component to this discomfort.  There is say there is no shortness of breath or cough.  The patient states it feels like a "ache".  There is no radiation.  Is a moderate discomfort.  It is nagging and persistent.  Past Medical History:  Diagnosis Date  . History of colon polyps   . Hyperlipidemia   . Osteoporosis     Patient Active Problem List   Diagnosis Date Noted  . Screening for breast cancer 02/14/2016  . Rash and nonspecific  skin eruption 02/14/2016  . HLD (hyperlipidemia) 02/24/2014  . Osteoporosis 02/24/2014    Past Surgical History:  Procedure Laterality Date  . BREAST BIOPSY  2007   CORE W/CLIP - NEG    Prior to Admission medications   Medication Sig Start Date End Date Taking? Authorizing Provider  aspirin 81 MG tablet Take 81 mg by mouth daily.    [provider]  BIOTIN 5000 PO Take 1 tablet by mouth daily.    [provider]  Calcium Citrate-Vitamin D (CAL-CITRATE PLUS VITAMIN D PO) Take 1 tablet by mouth daily.    [provider]  denosumab (PROLIA) 60 MG/ML SOLN injection Inject 60 mg into the skin every 6 (six) months. Administer in upper arm, thigh, or abdomen    [provider]  Multiple Vitamin (MULTIVITAMIN) tablet Take 1 tablet by mouth daily.    [provider]  simvastatin (ZOCOR) 10 MG tablet TAKE 1 TABLET BY MOUTH DAILY 04/24/16   Allegra Grana, FNP  triamcinolone (KENALOG) 0.025 % cream Apply 1 application topically 2 (two) times daily. 02/14/16   Allegra Grana, FNP    Allergies Patient has no known allergies.  Family History  Problem Relation Age of Onset  . Arthritis Mother   . Alcohol abuse Father   . Heart disease Father 21       CAD - died  . Hyperlipidemia Brother   . Diabetes  Brother   . Hypertension Brother   . Diabetes Brother   . Hyperlipidemia Brother   . Breast cancer Neg Hx     Social History Social History   Tobacco Use  . Smoking status: Never Smoker  . Smokeless tobacco: Never Used  Substance Use Topics  . Alcohol use: Yes    Comment: Occasional wine with dinner  . Drug use: No    Review of Systems Constitutional: No fever/chills Eyes: No visual changes. ENT: No sore throat. No stiff neck no neck pain Cardiovascular: + chest pain. Respiratory: Denies shortness of breath. Gastrointestinal:   no vomiting.  No diarrhea.  No constipation. Genitourinary: Negative for dysuria. Musculoskeletal:  Negative lower extremity swelling Skin: Negative for rash. Neurological: Negative for severe headaches, focal weakness or numbness.   ____________________________________________   PHYSICAL EXAM:  VITAL SIGNS: ED Triage Vitals  Enc Vitals Group     BP 10/17/17 1604 (!) 156/71     Pulse Rate 10/17/17 1604 85     Resp 10/17/17 1604 14     Temp 10/17/17 1604 98.1 F (36.7 C)     Temp Source 10/17/17 1604 Oral     SpO2 10/17/17 1604 98 %     Weight 10/17/17 1602 115 lb (52.2 kg)     Height 10/17/17 1602 4\' 10"  (1.473 m)     Head Circumference --      Peak Flow --      Pain Score 10/17/17 1602 4     Pain Loc --      Pain Edu? --      Excl. in GC? --     Constitutional: Alert and oriented. Well appearing and in no acute distress. Eyes: Conjunctivae are normal Head: Atraumatic HEENT: No congestion/rhinnorhea. Mucous membranes are moist.  Oropharynx non-erythematous Neck:   Nontender with no meningismus, no masses, no stridor Cardiovascular: Normal rate, regular rhythm. Grossly normal heart sounds.  Good peripheral circulation. Respiratory: Normal respiratory effort.  No retractions. Lungs CTAB. Chest: Female nurse present, patient consents to exam, there are no lesions noted, her chest wall is not markedly tender to palpation.  Patient can very specifically identify the area of discomfort which starts again in the midline of her back around T5 and goes around under her left breast towards the midline of her chest and then stops.  Does not cross the midline.  There is a very small red mark in the back, about 5 mm x 6 mm, blanchable, red, nontender not raised is not ulcerated or herpetic at this time although it is possibly consistent with early herpetic lesion, there is no blistering . Abdominal: Soft and nontender. No distention. No guarding no rebound Back:  There is no focal tenderness or step off.  there is no midline tenderness there are no lesions noted. there is no CVA  tenderness Musculoskeletal: No lower extremity tenderness, no upper extremity tenderness. No joint effusions, no DVT signs strong distal pulses no edema Neurologic:  Normal speech and language. No gross focal neurologic deficits are appreciated.  Skin:  Skin is warm, dry and intact. No rash noted. Psychiatric: Mood and affect are normal. Speech and behavior are normal.  ____________________________________________   LABS (all labs ordered are listed, but only abnormal results are displayed)  Labs Reviewed  BASIC METABOLIC PANEL - Abnormal; Notable for the following components:      Result Value   Glucose, Bld 135 (*)    All other components within normal limits  CBC  TROPONIN  I    Pertinent labs  results that were available during my care of the patient were reviewed by me and considered in my medical decision making (see chart for details). ____________________________________________  EKG  I personally interpreted any EKGs ordered by me or triage Sinus rhythm rate 88 bpm no acute ST elevation or depression normal axis unremarkable EKG ____________________________________________  RADIOLOGY  Pertinent labs & imaging results that were available during my care of the patient were reviewed by me and considered in my medical decision making (see chart for details). If possible, patient and/or family made aware of any abnormal findings.  Dg Chest 2 View  Result Date: 10/17/2017 CLINICAL DATA:  Intermittent LEFT chest pain for 3 days, now constant. EXAM: CHEST - 2 VIEW COMPARISON:  None. FINDINGS: Cardiomediastinal silhouette is normal. Calcified aortic arch. No pleural effusions or focal consolidations. Bandlike density LEFT lung base not well localized on lateral radiograph. Trachea projects midline and there is no pneumothorax. Soft tissue planes and included osseous structures are non-suspicious. Mild scoliosis. IMPRESSION: LEFT lung base atelectasis/scarring. Aortic Atherosclerosis  (ICD10-I70.0). Electronically Signed   By: Awilda Metro M.D.   On: 10/17/2017 16:48   ____________________________________________    PROCEDURES  Procedure(s) performed: None  Procedures  Critical Care performed: None  ____________________________________________   INITIAL IMPRESSION / ASSESSMENT AND PLAN / ED COURSE  Pertinent labs & imaging results that were available during my care of the patient were reviewed by me and considered in my medical decision making (see chart for details).  With left-sided very atypical chest discomfort constantly there now for 24 hours with a negative troponin reassuring EKG reassuring chest x-ray which I personally reviewed, reassuring vital signs, negative for likely PE risk factors with no pleuritic pain or shortness of breath, and a small red lesion on her back in the exact area of concern which certainly could be an early shingles lesion although this is not definitive.  There is no evidence of abscess or cellulitis.  We will give the patient Tylenol for her pain, given uninterrupted pain for 24 hours I do not see much utility in repeating cardiac enzymes, if she were damaging her heart with this discomfort she probably would have turned positive by now.  Certainly this is within the range of the test.  ----------------------------------------- 5:01 PM on 10/17/2017 -----------------------------------------  I personally reviewed x-ray, there is on the left side a poorly characterized left-sided lesion unclear what this is.  Will obtain a CT scan to further evaluate.    ____________________________________________   FINAL CLINICAL IMPRESSION(S) / ED DIAGNOSES  Final diagnoses:  None      This chart was dictated using voice recognition software.  Despite best efforts to proofread,  errors can occur which can change meaning.      Jeanmarie Plant, MD 10/17/17 1700    Jeanmarie Plant, MD 10/17/17 [phone]

## 2017-10-17 NOTE — ED Notes (Signed)
Reviewed discharge instructions, follow-up care with patient. Patient verbalized understanding of all information reviewed. Patient stable, with no distress noted at this time.    

## 2017-10-17 NOTE — ED Triage Notes (Signed)
Pain left chest --under breast --to left scapular area.  Started wed night--coing on and off.

## 2017-10-17 NOTE — Telephone Encounter (Signed)
Patient states she has had chest pain that has started Wednesday and radiating around to the back. Patient is calling because she is concerned that the symptoms are not going away. She is requesting labs and EKG. Due to her persistant symptoms- protocol recommends that she go to ED- she is reluctant to go - she states she will probably go- she will get a friend to drive her.  Reason for Disposition . [1] Intermittent  chest pain or "angina" AND [2] increasing in severity or frequency  (Exception: pains lasting a few seconds)  Answer Assessment - Initial Assessment Questions 1. LOCATION: "Where does it hurt?"       Ribcage left around to the back 2. RADIATION: "Does the pain go anywhere else?" (e.g., into neck, jaw, arms, back)     Just back area- all inclusive pain 3. ONSET: "When did the chest pain begin?" (Minutes, hours or days)      Wednesday evening 4. PATTERN "Does the pain come and go, or has it been constant since it started?"  "Does it get worse with exertion?"      Come and goes, no- although patient has not been active 5. DURATION: "How long does it last" (e.g., seconds, minutes, hours)     Constant- since noon 6. SEVERITY: "How bad is the pain?"  (e.g., Scale 1-10; mild, moderate, or severe)    - MILD (1-3): doesn't interfere with normal activities     - MODERATE (4-7): interferes with normal activities or awakens from sleep    - SEVERE (8-10): excruciating pain, unable to do any normal activities       4- moderate 7. CARDIAC RISK FACTORS: "Do you have any history of heart problems or risk factors for heart disease?" (e.g., prior heart attack, angina; high blood pressure, diabetes, being overweight, high cholesterol, smoking, or strong family history of heart disease)     Family history, BP this morning- 144/80, lunchtime 101/70 8. PULMONARY RISK FACTORS: "Do you have any history of lung disease?"  (e.g., blood clots in lung, asthma, emphysema, birth control pills)     no 9.  CAUSE: "What do you think is causing the chest pain?"     unknown 10. OTHER SYMPTOMS: "Do you have any other symptoms?" (e.g., dizziness, nausea, vomiting, sweating, fever, difficulty breathing, cough)       No trama 11. PREGNANCY: "Is there any chance you are pregnant?" "When was your last menstrual period?"       n/a  Protocols used: CHEST PAIN-A-AH

## 2017-10-17 NOTE — ED Triage Notes (Signed)
First nurse note-here for CP. Placed in wheelchair.  Pulled for EKG

## 2017-10-17 NOTE — Discharge Instructions (Addendum)
Return to the emergency room for any new or worsening symptoms including fever, vomiting, increased pain in the chest shortness of breath or you feel worse in any way.  Take Tylenol and ibuprofen as tolerated for the pain.  Do not fail to follow up with cardiology as soon as Monday morning

## 2017-10-17 NOTE — Telephone Encounter (Signed)
FYI, patient is in ED at this moment

## 2017-10-20 ENCOUNTER — Telehealth: Payer: Self-pay

## 2017-10-20 DIAGNOSIS — R21 Rash and other nonspecific skin eruption: Secondary | ICD-10-CM

## 2017-10-20 MED ORDER — VALACYCLOVIR HCL 1 G PO TABS: 1000.0000 mg | ORAL_TABLET | Freq: Three times a day (TID) | ORAL | 0 refills | Status: DC

## 2017-10-20 NOTE — Telephone Encounter (Signed)
Copied from CRM [phone]. Topic: Appointment Scheduling - Same Day Appointment >> Oct 20, 2017  8:58 AM Diana EvesHoyt, Maryann B wrote: Patient called to schedule an appointment for TODAY with any provider. Pt went to ER Friday and was told it was Shingles, pt states the rash has erupted. She is hoping to get in today to get some help with pain. She is hoping to get anti viral medication since the eruption happened last night.   >> Oct 20, 2017  1:36 PM Gerrianne ScalePayne, Angela L wrote: Patient calling back to check on message that she had left earlier today

## 2017-10-20 NOTE — Telephone Encounter (Signed)
Call pt Reviewed ed course  Since rash has erupted, I have called in valtrex which is for shingles. Ensure no cardiac complaints as that was discussed in ED.  She may also use OTC capsaicin which is often used for nerve pain. It is made from hot peppers so ensure she uses small dot at first to ensure no reaction.   Please make her an appt for 1st available with me - a 1:15 slot is okay

## 2017-10-20 NOTE — Addendum Note (Signed)
Addended by: Allegra GranaARNETT, Aimy Sweeting G on: 10/20/2017 03:03 PM   Modules accepted: Orders

## 2017-10-20 NOTE — Telephone Encounter (Signed)
Copied from CRM [phone]. Topic: Appointment Scheduling - Same Day Appointment >> Oct 20, 2017  8:58 AM Diana EvesHoyt, Maryann B wrote: Patient called to schedule an appointment for TODAY with any provider. Pt went to ER Friday and was told it was Shingles, pt states the rash has erupted. She is hoping to get in today to get some help with pain. She is hoping to get anti viral medication since the eruption happened last night.

## 2017-10-20 NOTE — Telephone Encounter (Signed)
Patient notified that Claris CheMargaret has sent in valtrex and recommend patient try capsaicin for the nerve pain. Patient is also scheduled for next available which is on 10-22-17 at 1:15. Patient verbalized understanding of all instructions.

## 2017-10-20 NOTE — Telephone Encounter (Signed)
Pleas advise.

## 2017-10-22 ENCOUNTER — Encounter: Payer: Self-pay | Admitting: Family

## 2017-10-22 ENCOUNTER — Ambulatory Visit: Payer: Medicare Other | Admitting: Family

## 2017-10-22 VITALS — BP 130/72 | HR 88 | Temp 98.5°F | Resp 16 | Wt 116.5 lb

## 2017-10-22 DIAGNOSIS — B029 Zoster without complications: Secondary | ICD-10-CM

## 2017-10-22 DIAGNOSIS — I700 Atherosclerosis of aorta: Secondary | ICD-10-CM | POA: Diagnosis not present

## 2017-10-22 DIAGNOSIS — E782 Mixed hyperlipidemia: Secondary | ICD-10-CM | POA: Diagnosis not present

## 2017-10-22 MED ORDER — TRAMADOL HCL 50 MG PO TABS: 50.0000 mg | ORAL_TABLET | Freq: Three times a day (TID) | ORAL | 0 refills | Status: DC | PRN

## 2017-10-22 NOTE — Assessment & Plan Note (Signed)
Discussed and reviewed images from emergency room visit which showed atherosclerosis.  Patient is currently on Zocor.  We also discussed based on her age, hyperlipidemia, it was reasonable to have a cardiac consult as patient is never had any risk stratification including stress test.  Referral placed however reassured as patient is not having any cardiac complaints at this time.  Will follow

## 2017-10-22 NOTE — Patient Instructions (Signed)
So sorry for your pain.  Tramadol  Capsaicin  Referral to cardiology for risk stratification.   Let me know how you are doing

## 2017-10-22 NOTE — Assessment & Plan Note (Addendum)
On Valtrex.  Slight improvement.  Patient in significant pain especially at night.  Given tramadol to use sparingly.  Patient will let me know how she is doing.   Of note, [place] database.  No concern for abuse with this patient.  We discussed side effects, risk of this medication.

## 2017-10-22 NOTE — Progress Notes (Signed)
Subjective:    Patient ID: Janet Shields, female    DOB: October 15, 1935, 82 y.o.   MRN: [id number]  CC: Janet Shields is a 82 y.o. female who presents today for an acute visit.    HPI: Pain started 8 days ago between breast on left side. Improving with medication however at night, pain is severe. Taking advil PM with little relief.   alternating between Tylenol and ibuprofen with little pain relief   Had zoster vaccine  Emergency room had asked to follow up with cardiology.  She has not done this yet.  She was reassured when she saw the rash.  Exercises regularly- 2-3 miles on treadmill daily,  silver sneakers 2x/ week  Denies exertional chest pain or pressure, numbness or tingling radiating to left arm or jaw, palpitations, dizziness, frequent headaches, changes in vision, or shortness of breath.      Patient seen in the emergency room 5 days ago for atypical chest pain.  Chest x-ray showed left lung base atelectasis, aortic atherosclerosis, no pleural effusion.  It might be early shingles however there is no lesion at this time.  She was given Tylenol.  CT angios -did not show pulmonary emboli. It showed  mild aortic atherosclerosis.  No visible coronary artery calcification.  Abnormal linear markings appear to be more scarring and active atelectasis. ED recommended cardiologist.  HIISTORY:  Past Medical History:  Diagnosis Date  . History of colon polyps   . Hyperlipidemia   . Osteoporosis    Past Surgical History:  Procedure Laterality Date  . BREAST BIOPSY  2007   CORE W/CLIP - NEG   Family History  Problem Relation Age of Onset  . Arthritis Mother   . Alcohol abuse Father   . Heart disease Father [id number]       CAD - died  . Hyperlipidemia Brother   . Diabetes Brother   . Hypertension Brother   . Diabetes Brother   . Hyperlipidemia Brother   . Breast cancer Neg Hx     Allergies: Patient has no known allergies. Current Outpatient Medications on File Prior to Visit   Medication Sig Dispense Refill  . Calcium Citrate-Vitamin D (CAL-CITRATE PLUS VITAMIN D PO) Take 1 tablet by mouth daily.    Marland Kitchen. denosumab (PROLIA) 60 MG/ML SOLN injection Inject 60 mg into the skin every 6 (six) months. Administer in upper arm, thigh, or abdomen    . ibuprofen (ADVIL,MOTRIN) 200 MG tablet Take 200 mg by mouth every 6 (six) hours as needed.    . Multiple Vitamin (MULTIVITAMIN) tablet Take 1 tablet by mouth daily.    . simvastatin (ZOCOR) 10 MG tablet TAKE 1 TABLET BY MOUTH DAILY 90 tablet 1  . triamcinolone (KENALOG) 0.025 % cream Apply 1 application topically 2 (two) times daily. 15 g 0  . valACYclovir (VALTREX) 1000 MG tablet Take 1 tablet (1,000 mg total) by mouth 3 (three) times daily. 21 tablet 0   No current facility-administered medications on file prior to visit.     Social History   Tobacco Use  . Smoking status: Never Smoker  . Smokeless tobacco: Never Used  Substance Use Topics  . Alcohol use: Yes    Comment: Occasional wine with dinner  . Drug use: No    Review of Systems  Constitutional: Negative for chills and fever.  Respiratory: Negative for cough.   Cardiovascular: Negative for chest pain and palpitations.  Gastrointestinal: Negative for nausea and vomiting.  Skin: Positive for rash.  Objective:    BP 130/72 (BP Location: Left Arm, Patient Position: Sitting, Cuff Size: Normal)   Pulse 88   Temp 98.5 F (36.9 C) (Oral)   Resp 16   Wt 116 lb 8 oz (52.8 kg)   SpO2 98%   BMI 24.35 kg/m    Physical Exam  Constitutional: She appears well-developed and well-nourished.  Eyes: Conjunctivae are normal.  Cardiovascular: Normal rate, regular rhythm, normal heart sounds and normal pulses.  Pulmonary/Chest: Effort normal and breath sounds normal. She has no wheezes. She has no rhonchi. She has no rales.  Neurological: She is alert.  Skin: Skin is warm and dry.     Vesicular lesions erythematous base following dermatomal pattern.  Does not  cross midline.  Psychiatric: She has a normal mood and affect. Her speech is normal and behavior is normal. Thought content normal.  Vitals reviewed.      Assessment & Plan:   Problem List Items Addressed This Visit      Cardiovascular and Mediastinum   Atherosclerosis of aorta [facility])    Discussed and reviewed images from emergency room visit which showed atherosclerosis.  Patient is currently on Zocor.  We also discussed based on her age, hyperlipidemia, it was reasonable to have a cardiac consult as patient is never had any risk stratification including stress test.  Referral placed however reassured as patient is not having any cardiac complaints at this time.  Will follow        Other   HLD (hyperlipidemia)   Relevant Orders   Ambulatory referral to Cardiology   Herpes zoster without complication - Primary    On Valtrex.  Slight improvement.  Patient in significant pain especially at night.  Given tramadol to use sparingly.  Patient will let me know how she is doing.   Of note, unable to find patient in [place] controlled substance reporting database.  No concern for abuse with this patient.  We discussed side effects, risk of this medication.      Relevant Medications   traMADol (ULTRAM) 50 MG tablet        I am having Janet Shields start on traMADol. I am also having her maintain her denosumab, multivitamin, Calcium Citrate-Vitamin D (CAL-CITRATE PLUS VITAMIN D PO), triamcinolone, simvastatin, valACYclovir, and ibuprofen.   Meds ordered this encounter  Medications  . traMADol (ULTRAM) 50 MG tablet    Sig: Take 1 tablet (50 mg total) by mouth every 8 (eight) hours as needed.    Dispense:  30 tablet    Refill:  0    Order Specific Question:   Supervising Provider    Answer:   Sherlene Shams [[id number]]    Return precautions given.   Risks, benefits, and alternatives of the medications and treatment plan prescribed today were discussed, and patient expressed  understanding.   Education regarding symptom management and diagnosis given to patient on AVS.  Continue to follow with Allegra Grana, FNP for routine health maintenance.   Janet Shields and I agreed with plan.   [name], FNP

## 2017-10-23 ENCOUNTER — Encounter (INDEPENDENT_AMBULATORY_CARE_PROVIDER_SITE_OTHER): Payer: Self-pay

## 2017-12-06 ENCOUNTER — Other Ambulatory Visit: Payer: Self-pay | Admitting: Family

## 2018-02-25 ENCOUNTER — Ambulatory Visit (INDEPENDENT_AMBULATORY_CARE_PROVIDER_SITE_OTHER): Payer: Medicare Other | Admitting: Family

## 2018-02-25 ENCOUNTER — Encounter: Payer: Self-pay | Admitting: Family

## 2018-02-25 VITALS — BP 114/70 | HR 79 | Temp 98.3°F | Resp 15 | Ht 59.0 in | Wt 114.2 lb

## 2018-02-25 DIAGNOSIS — R591 Generalized enlarged lymph nodes: Secondary | ICD-10-CM

## 2018-02-25 DIAGNOSIS — Z0000 Encounter for general adult medical examination without abnormal findings: Secondary | ICD-10-CM

## 2018-02-25 DIAGNOSIS — M810 Age-related osteoporosis without current pathological fracture: Secondary | ICD-10-CM

## 2018-02-25 NOTE — Progress Notes (Signed)
Subjective:    Patient ID: Janet Shields, female    DOB: Mar 14, 1936, 82 y.o.   MRN: [id number]  CC: Janet Shields is a 82 y.o. female who presents today for physical exam.    HPI: Enlarged 'lymph node' on right side of neck while feeling neck, 6 weeks ago, unchanged.Nontender. No discharge.  No cough, fatigue, weight changes, sore throat, congestion, night sweats, bone pain, trouble swallowing.   Interested in shingles vaccine. Shingles 10/2017.   Dr Lady GaryFath- 10/2017- deferred  In absence of complaints.   Colorectal Cancer Screening: Done in 2008. No changes in bowel habits.  Breast Cancer Screening: Mammogram UTD Cervical Cancer Screening: No vaginal bleeding.  Bone Health screening/DEXA for 65+: In prolia; follows with Dr Danise EdgeKernoldle Lung Cancer Screening: Doesn't have 30 year pack year history and age > 55 years.       Tetanus - due        Labs: Screening labs today. Exercise: Gets regular exercise.  Alcohol use: occasional Smoking/tobacco use: Nonsmoker.   Wears seat belt: Yes.  HISTORY:  Past Medical History:  Diagnosis Date  . History of colon polyps   . Hyperlipidemia   . Osteoporosis     Past Surgical History:  Procedure Laterality Date  . BREAST BIOPSY  2007   CORE W/CLIP - NEG   Family History  Problem Relation Age of Onset  . Arthritis Mother   . Alcohol abuse Father   . Heart disease Father 4052       CAD - died  . Hyperlipidemia Brother   . Diabetes Brother   . Hypertension Brother   . Diabetes Brother   . Hyperlipidemia Brother   . Breast cancer Neg Hx       ALLERGIES: Tramadol  Current Outpatient Medications on File Prior to Visit  Medication Sig Dispense Refill  . Biotin [id number] MCG TABS Take by mouth daily.    . Calcium Citrate-Vitamin D (CAL-CITRATE PLUS VITAMIN D PO) Take 1 tablet by mouth daily.    Marland Kitchen. denosumab (PROLIA) 60 MG/ML SOLN injection Inject 60 mg into the skin every 6 (six) months. Administer in upper arm, thigh, or abdomen    .  Multiple Vitamin (MULTIVITAMIN) tablet Take 1 tablet by mouth daily.    . Multiple Vitamins-Minerals (OCUVITE EYE HEATLH GUMMIES PO) Take by mouth daily.    . simvastatin (ZOCOR) 10 MG tablet TAKE 1 TABLET BY MOUTH DAILY 90 tablet 0  . triamcinolone (KENALOG) 0.025 % cream Apply 1 application topically 2 (two) times daily. 15 g 0   No current facility-administered medications on file prior to visit.     Social History   Tobacco Use  . Smoking status: Never Smoker  . Smokeless tobacco: Never Used  Substance Use Topics  . Alcohol use: Yes    Comment: Occasional wine with dinner  . Drug use: No    Review of Systems  Constitutional: Negative for chills, fever and unexpected weight change.  HENT: Negative for congestion.   Respiratory: Negative for cough.   Cardiovascular: Negative for chest pain, palpitations and leg swelling.  Gastrointestinal: Negative for nausea and vomiting.  Musculoskeletal: Negative for arthralgias and myalgias.  Skin: Negative for rash.  Neurological: Negative for headaches.  Hematological: Positive for adenopathy.  Psychiatric/Behavioral: Negative for confusion.      Objective:    BP 114/70 (BP Location: Left Arm, Patient Position: Sitting, Cuff Size: Normal)   Pulse 79   Temp 98.3 F (36.8 C) (Oral)   Resp 15  Ht 4\' 11"  (1.499 m)   Wt 114 lb 4 oz (51.8 kg)   SpO2 97%   BMI 23.08 kg/m   BP Readings from Last 3 Encounters:  02/25/18 114/70  10/22/17 130/72  10/17/17 (!) 152/81   Wt Readings from Last 3 Encounters:  02/25/18 114 lb 4 oz (51.8 kg)  10/22/17 116 lb 8 oz (52.8 kg)  10/17/17 115 lb (52.2 kg)    Physical Exam  Constitutional: She appears well-developed and well-nourished.  Eyes: Conjunctivae are normal.  Neck: No thyroid mass and no thyromegaly present.  Cardiovascular: Normal rate, regular rhythm, normal heart sounds and normal pulses.  Pulmonary/Chest: Effort normal and breath sounds normal. She has no wheezes. She has no  rhonchi. She has no rales. Right breast exhibits no inverted nipple, no mass, no nipple discharge, no skin change and no tenderness. Left breast exhibits no inverted nipple, no mass, no nipple discharge, no skin change and no tenderness. Breasts are symmetrical.  CBE performed.   Lymphadenopathy:       Head (right side): Submandibular adenopathy present. No submental, no tonsillar, no preauricular, no posterior auricular and no occipital adenopathy present.       Head (left side): No submental, no submandibular, no tonsillar, no preauricular, no posterior auricular and no occipital adenopathy present.    She has no cervical adenopathy.       Right cervical: No superficial cervical, no deep cervical and no posterior cervical adenopathy present.      Left cervical: No superficial cervical, no deep cervical and no posterior cervical adenopathy present.    She has no axillary adenopathy.  Subtle difference in size from right larger than left. Non tender. Not fluctuant. No erythema, increased warmth  Neurological: She is alert.  Skin: Skin is warm and dry.  Psychiatric: She has a normal mood and affect. Her speech is normal and behavior is normal. Thought content normal.  Vitals reviewed.      Assessment & Plan:   Problem List Items Addressed This Visit      Musculoskeletal and Integument   Osteoporosis    Continues to be on Prolia.  Follows with rheumatology.      Relevant Orders   VITAMIN D 25 Hydroxy (Vit-D Deficiency, Fractures)   Comprehensive metabolic panel     Immune and Lymphatic   Lymphadenopathy    No recent URI. Asymptomatic. Pending [place] of head and neck. Discussed CT head and neck if doesn't resolve.       Relevant Orders   US Soft Tissue Head/Neck     Other   Routine physical examination - Primary    Clinical breast exam performed.  Discussed at length with patient Shingrix, flu, Tdap vaccine recommendations per CDC.   Advised 6 months in between shingles vaccine and  shingles outbreak.  Also advised that she does not have a shingles vaccine with tetanus vaccine.  She may could administer flu and Shingrix.  See AVS. Deferred pelvic exam in the absence of complaints today. Patient  Would like labs ordered as they were last year.  This is been done and patient will return when she is fasting for this.      Relevant Orders   VITAMIN D 25 Hydroxy (Vit-D Deficiency, Fractures)   Comprehensive metabolic panel   CBC with Differential/Platelet   TSH   Lipid panel   Hemoglobin A1c       I have discontinued Anayiah Sagrero's valACYclovir, ibuprofen, and traMADol. I am also having her maintain  her denosumab, multivitamin, Calcium Citrate-Vitamin D (CAL-CITRATE PLUS VITAMIN D PO), triamcinolone, simvastatin, Multiple Vitamins-Minerals (OCUVITE EYE HEATLH GUMMIES PO), and Biotin.   No orders of the defined types were placed in this encounter.   Return precautions given.   Risks, benefits, and alternatives of the medications and treatment plan prescribed today were discussed, and patient expressed understanding.   Education regarding symptom management and diagnosis given to patient on AVS.   Continue to follow with Allegra GranaArnett, Margaret G, FNP for routine health maintenance.   Janet Shields and I agreed with plan.   Rennie PlowmanMargaret Arnett, FNP

## 2018-02-25 NOTE — Assessment & Plan Note (Addendum)
Clinical breast exam performed.  Discussed at length with patient Shingrix, flu, Tdap vaccine recommendations per CDC.   Advised 6 months in between shingles vaccine and shingles outbreak.  Also advised that she does not have a shingles vaccine with tetanus vaccine.  She may could administer flu and Shingrix.  See AVS. Deferred pelvic exam in the absence of complaints today. Patient  Would like labs ordered as they were last year.  This is been done and patient will return when she is fasting for this.

## 2018-02-25 NOTE — Patient Instructions (Addendum)
Tetanus AND shingrex vaccine at local pharmacy  Do not get Tetanus with shingrex vaccine.   You may get flu vaccine with shingrex vaccine if you desire.  Today we discussed referrals, orders. Ultrasound of neck Please be sure to give [place] a call if you have not heard from our office regarding scheduling a test or regarding referral in a timely manner.  It is very important that you let me know as soon as possible.       Health Maintenance for Postmenopausal Women Menopause is a normal process in which your reproductive ability comes to an end. This process happens gradually over a span of months to years, usually between the ages of 48 and 24. Menopause is complete when you have missed 12 consecutive menstrual periods. It is important to talk with your health care provider about some of the most common conditions that affect postmenopausal women, such as heart disease, cancer, and bone loss (osteoporosis). Adopting a healthy lifestyle and getting preventive care can help to promote your health and wellness. Those actions can also lower your chances of developing some of these common conditions. What should I know about menopause? During menopause, you may experience a number of symptoms, such as:  Moderate-to-severe hot flashes.  Night sweats.  Decrease in sex drive.  Mood swings.  Headaches.  Tiredness.  Irritability.  Memory problems.  Insomnia.  Choosing to treat or not to treat menopausal changes is an individual decision that you make with your health care provider. What should I know about hormone replacement therapy and supplements? Hormone therapy products are effective for treating symptoms that are associated with menopause, such as hot flashes and night sweats. Hormone replacement carries certain risks, especially as you become older. If you are thinking about using estrogen or estrogen with progestin treatments, discuss the benefits and risks with your health care  provider. What should I know about heart disease and stroke? Heart disease, heart attack, and stroke become more likely as you age. This may be due, in part, to the hormonal changes that your body experiences during menopause. These can affect how your body processes dietary fats, triglycerides, and cholesterol. Heart attack and stroke are both medical emergencies. There are many things that you can do to help prevent heart disease and stroke:  Have your blood pressure checked at least every 1-2 years. High blood pressure causes heart disease and increases the risk of stroke.  If you are 30-52 years old, ask your health care provider if you should take aspirin to prevent a heart attack or a stroke.  Do not use any tobacco products, including cigarettes, chewing tobacco, or electronic cigarettes. If you need help quitting, ask your health care provider.  It is important to eat a healthy diet and maintain a healthy weight. ? Be sure to include plenty of vegetables, fruits, low-fat dairy products, and lean protein. ? Avoid eating foods that are high in solid fats, added sugars, or salt (sodium).  Get regular exercise. This is one of the most important things that you can do for your health. ? Try to exercise for at least 150 minutes each week. The type of exercise that you do should increase your heart rate and make you sweat. This is known as moderate-intensity exercise. ? Try to do strengthening exercises at least twice each week. Do these in addition to the moderate-intensity exercise.  Know your numbers.Ask your health care provider to check your cholesterol and your blood glucose. Continue to have your  blood tested as directed by your health care provider.  What should I know about cancer screening? There are several types of cancer. Take the following steps to reduce your risk and to catch any cancer development as early as possible. Breast Cancer  Practice breast self-awareness. ? This  means understanding how your breasts normally appear and feel. ? It also means doing regular breast self-exams. Let your health care provider know about any changes, no matter how small.  If you are 76 or older, have a clinician do a breast exam (clinical breast exam or CBE) every year. Depending on your age, family history, and medical history, it may be recommended that you also have a yearly breast X-ray (mammogram).  If you have a family history of breast cancer, talk with your health care provider about genetic screening.  If you are at high risk for breast cancer, talk with your health care provider about having an MRI and a mammogram every year.  Breast cancer (BRCA) gene test is recommended for women who have family members with BRCA-related cancers. Results of the assessment will determine the need for genetic counseling and BRCA1 and for BRCA2 testing. BRCA-related cancers include these types: ? Breast. This occurs in males or females. ? Ovarian. ? Tubal. This may also be called fallopian tube cancer. ? Cancer of the abdominal or pelvic lining (peritoneal cancer). ? Prostate. ? Pancreatic.  Cervical, Uterine, and Ovarian Cancer Your health care provider may recommend that you be screened regularly for cancer of the pelvic organs. These include your ovaries, uterus, and vagina. This screening involves a pelvic exam, which includes checking for microscopic changes to the surface of your cervix (Pap test).  For women ages 21-65, health care providers may recommend a pelvic exam and a Pap test every three years. For women ages 41-65, they may recommend the Pap test and pelvic exam, combined with testing for human papilloma virus (HPV), every five years. Some types of HPV increase your risk of cervical cancer. Testing for HPV may also be done on women of any age who have unclear Pap test results.  Other health care providers may not recommend any screening for nonpregnant women who are  considered low risk for pelvic cancer and have no symptoms. Ask your health care provider if a screening pelvic exam is right for you.  If you have had past treatment for cervical cancer or a condition that could lead to cancer, you need Pap tests and screening for cancer for at least 20 years after your treatment. If Pap tests have been discontinued for you, your risk factors (such as having a new sexual partner) need to be reassessed to determine if you should start having screenings again. Some women have medical problems that increase the chance of getting cervical cancer. In these cases, your health care provider may recommend that you have screening and Pap tests more often.  If you have a family history of uterine cancer or ovarian cancer, talk with your health care provider about genetic screening.  If you have vaginal bleeding after reaching menopause, tell your health care provider.  There are currently no reliable tests available to screen for ovarian cancer.  Lung Cancer Lung cancer screening is recommended for adults 89-12 years old who are at high risk for lung cancer because of a history of smoking. A yearly low-dose CT scan of the lungs is recommended if you:  Currently smoke.  Have a history of at least 30 pack-years of smoking  and you currently smoke or have quit within the past 15 years. A pack-year is smoking an average of one pack of cigarettes per day for one year.  Yearly screening should:  Continue until it has been 15 years since you quit.  Stop if you develop a health problem that would prevent you from having lung cancer treatment.  Colorectal Cancer  This type of cancer can be detected and can often be prevented.  Routine colorectal cancer screening usually begins at age 73 and continues through age 18.  If you have risk factors for colon cancer, your health care provider may recommend that you be screened at an earlier age.  If you have a family history of  colorectal cancer, talk with your health care provider about genetic screening.  Your health care provider may also recommend using home test kits to check for hidden blood in your stool.  A small camera at the end of a tube can be used to examine your colon directly (sigmoidoscopy or colonoscopy). This is done to check for the earliest forms of colorectal cancer.  Direct examination of the colon should be repeated every 5-10 years until age 22. However, if early forms of precancerous polyps or small growths are found or if you have a family history or genetic risk for colorectal cancer, you may need to be screened more often.  Skin Cancer  Check your skin from head to toe regularly.  Monitor any moles. Be sure to tell your health care provider: ? About any new moles or changes in moles, especially if there is a change in a mole's shape or color. ? If you have a mole that is larger than the size of a pencil eraser.  If any of your family members has a history of skin cancer, especially at a young age, talk with your health care provider about genetic screening.  Always use sunscreen. Apply sunscreen liberally and repeatedly throughout the day.  Whenever you are outside, protect yourself by wearing long sleeves, pants, a wide-brimmed hat, and sunglasses.  What should I know about osteoporosis? Osteoporosis is a condition in which bone destruction happens more quickly than new bone creation. After menopause, you may be at an increased risk for osteoporosis. To help prevent osteoporosis or the bone fractures that can happen because of osteoporosis, the following is recommended:  If you are 28-58 years old, get at least 1,000 mg of calcium and at least 600 mg of vitamin D per day.  If you are older than age 51 but younger than age 31, get at least 1,200 mg of calcium and at least 600 mg of vitamin D per day.  If you are older than age 59, get at least 1,200 mg of calcium and at least 800 mg  of vitamin D per day.  Smoking and excessive alcohol intake increase the risk of osteoporosis. Eat foods that are rich in calcium and vitamin D, and do weight-bearing exercises several times each week as directed by your health care provider. What should I know about how menopause affects my mental health? Depression may occur at any age, but it is more common as you become older. Common symptoms of depression include:  Low or sad mood.  Changes in sleep patterns.  Changes in appetite or eating patterns.  Feeling an overall lack of motivation or enjoyment of activities that you previously enjoyed.  Frequent crying spells.  Talk with your health care provider if you think that you are experiencing  depression. What should I know about immunizations? It is important that you get and maintain your immunizations. These include:  Tetanus, diphtheria, and pertussis (Tdap) booster vaccine.  Influenza every year before the flu season begins.  Pneumonia vaccine.  Shingles vaccine.  Your health care provider may also recommend other immunizations. This information is not intended to replace advice given to you by your health care provider. Make sure you discuss any questions you have with your health care provider. Document Released: 08/23/2005 Document Revised: 01/19/2016 Document Reviewed: 04/04/2015 Elsevier Interactive Patient Education  2018 [employer].

## 2018-02-25 NOTE — Assessment & Plan Note (Signed)
No recent URI. Asymptomatic. Pending US of head and neck. Discussed CT head and neck if doesn't resolve.

## 2018-02-25 NOTE — Assessment & Plan Note (Signed)
Continues to be on Prolia.  Follows with rheumatology.

## 2018-02-26 ENCOUNTER — Other Ambulatory Visit (INDEPENDENT_AMBULATORY_CARE_PROVIDER_SITE_OTHER): Payer: Medicare Other

## 2018-02-26 DIAGNOSIS — Z0000 Encounter for general adult medical examination without abnormal findings: Secondary | ICD-10-CM | POA: Diagnosis not present

## 2018-02-26 DIAGNOSIS — M810 Age-related osteoporosis without current pathological fracture: Secondary | ICD-10-CM | POA: Diagnosis not present

## 2018-02-26 LAB — CBC WITH DIFFERENTIAL/PLATELET
BASOS PCT: 0.6 % (ref 0.0–3.0)
Basophils Absolute: 0 10*3/uL (ref 0.0–0.1)
Eosinophils Absolute: 0.1 10*3/uL (ref 0.0–0.7)
Eosinophils Relative: 3.1 % (ref 0.0–5.0)
HEMATOCRIT: 42.5 % (ref 36.0–46.0)
HEMOGLOBIN: 14.5 g/dL (ref 12.0–15.0)
Lymphocytes Relative: 30.6 % (ref 12.0–46.0)
Lymphs Abs: 1 10*3/uL (ref 0.7–4.0)
MCHC: 34.1 g/dL (ref 30.0–36.0)
MCV: 90.7 fl (ref 78.0–100.0)
MONO ABS: 0.5 10*3/uL (ref 0.1–1.0)
MONOS PCT: 15.3 % — AB (ref 3.0–12.0)
Neutro Abs: 1.6 10*3/uL (ref 1.4–7.7)
Neutrophils Relative %: 50.4 % (ref 43.0–77.0)
Platelets: 254 10*3/uL (ref 150.0–400.0)
RBC: 4.69 Mil/uL (ref 3.87–5.11)
RDW: 13 % (ref 11.5–15.5)
WBC: 3.2 10*3/uL — AB (ref 4.0–10.5)

## 2018-02-26 LAB — COMPREHENSIVE METABOLIC PANEL
ALBUMIN: 4.3 g/dL (ref 3.5–5.2)
ALK PHOS: 31 U/L — AB (ref 39–117)
ALT: 15 U/L (ref 0–35)
AST: 19 U/L (ref 0–37)
BUN: 16 mg/dL (ref 6–23)
CALCIUM: 9.8 mg/dL (ref 8.4–10.5)
CHLORIDE: 102 meq/L (ref 96–112)
CO2: 32 mEq/L (ref 19–32)
Creatinine, Ser: 0.68 mg/dL (ref 0.40–1.20)
GFR: 88.52 mL/min (ref >60.00)
Glucose, Bld: 91 mg/dL (ref 70–99)
POTASSIUM: 4 meq/L (ref 3.5–5.1)
SODIUM: 141 meq/L (ref 135–145)
Total Bilirubin: 0.6 mg/dL (ref 0.2–1.2)
Total Protein: 7.3 g/dL (ref 6.0–8.3)

## 2018-02-26 LAB — TSH: TSH: 1.26 u[IU]/mL (ref 0.35–4.50)

## 2018-02-26 LAB — VITAMIN D 25 HYDROXY (VIT D DEFICIENCY, FRACTURES): VITD: 43.88 ng/mL (ref 30.00–100.00)

## 2018-02-26 LAB — LIPID PANEL
CHOLESTEROL: 202 mg/dL — AB (ref 0–200)
HDL: 74.5 mg/dL (ref >39.00)
LDL CALC: 112 mg/dL — AB (ref 0–99)
NonHDL: 127.47
Total CHOL/HDL Ratio: 3
Triglycerides: 75 mg/dL (ref 0.0–149.0)
VLDL: 15 mg/dL (ref 0.0–40.0)

## 2018-02-26 LAB — HEMOGLOBIN A1C: Hgb A1c MFr Bld: 6 % (ref 4.6–6.5)

## 2018-03-01 ENCOUNTER — Telehealth: Payer: Self-pay | Admitting: Family

## 2018-03-01 DIAGNOSIS — D72819 Decreased white blood cell count, unspecified: Secondary | ICD-10-CM

## 2018-03-01 DIAGNOSIS — E876 Hypokalemia: Secondary | ICD-10-CM

## 2018-03-01 NOTE — Telephone Encounter (Signed)
close

## 2018-03-05 ENCOUNTER — Telehealth: Payer: Self-pay

## 2018-03-05 ENCOUNTER — Ambulatory Visit
Admission: RE | Admit: 2018-03-05 | Discharge: 2018-03-05 | Disposition: A | Discharge status: Home / Self Care | Payer: Medicare Other | Source: Ambulatory Visit | Attending: Family | Admitting: Family

## 2018-03-05 DIAGNOSIS — E041 Nontoxic single thyroid nodule: Secondary | ICD-10-CM | POA: Insufficient documentation

## 2018-03-05 DIAGNOSIS — R591 Generalized enlarged lymph nodes: Secondary | ICD-10-CM | POA: Insufficient documentation

## 2018-03-05 NOTE — Telephone Encounter (Signed)
Copied from CRM [id number]. Topic: Inquiry >> Mar 05, 2018  2:47 PM Tamela OddiMartin, [place], NT wrote: #Reason for CRM: [facility] states [facility] needs the patient most current labs due to the provider wanting to give there patient her prolia injection. Pleased fax FAX#[phone]  [facility] listed [facility].

## 2018-03-11 ENCOUNTER — Telehealth: Payer: Self-pay | Admitting: Family

## 2018-03-11 ENCOUNTER — Other Ambulatory Visit: Payer: Self-pay | Admitting: Family

## 2018-03-11 DIAGNOSIS — E785 Hyperlipidemia, unspecified: Secondary | ICD-10-CM

## 2018-03-11 DIAGNOSIS — E041 Nontoxic single thyroid nodule: Secondary | ICD-10-CM

## 2018-03-11 MED ORDER — SIMVASTATIN 20 MG PO TABS: 20.0000 mg | ORAL_TABLET | Freq: Every day | ORAL | 3 refills | Status: DC

## 2018-03-11 NOTE — Telephone Encounter (Signed)
Pt returned call for her xray report and had questions regarding her low white blood count and simvastatin.  She would like to increase he simvastatin to 20 mg from 10 mg.  And then having her lipids checked in 3 months. So she wants to know if it is urgent to go ahead and recheck the WBC or can it wait until the 3 months when checking her lipids. Also to have her xray report sent to MyChart. Is there an email that she can use to communicate instead of trying to reply in MyChart? She has difficulties with replying back thru MyChart.

## 2018-03-11 NOTE — Progress Notes (Signed)
Patient advised of below advised and verbalized understanding

## 2018-03-11 NOTE — Progress Notes (Signed)
Call pt  I have ordered US thyroid  We will call to schedule; let us know if you dont hear from us

## 2018-03-17 ENCOUNTER — Other Ambulatory Visit: Payer: Medicare Other

## 2018-03-17 ENCOUNTER — Other Ambulatory Visit (INDEPENDENT_AMBULATORY_CARE_PROVIDER_SITE_OTHER): Payer: Medicare Other

## 2018-03-17 DIAGNOSIS — E876 Hypokalemia: Secondary | ICD-10-CM | POA: Diagnosis not present

## 2018-03-17 DIAGNOSIS — D72819 Decreased white blood cell count, unspecified: Secondary | ICD-10-CM

## 2018-03-18 LAB — BASIC METABOLIC PANEL
BUN: 15 mg/dL (ref 6–23)
CHLORIDE: 102 meq/L (ref 96–112)
CO2: 31 mEq/L (ref 19–32)
CREATININE: 0.74 mg/dL (ref 0.40–1.20)
Calcium: 10 mg/dL (ref 8.4–10.5)
GFR: 80.28 mL/min (ref >60.00)
Glucose, Bld: 110 mg/dL — ABNORMAL HIGH (ref 70–99)
POTASSIUM: 3.9 meq/L (ref 3.5–5.1)
SODIUM: 141 meq/L (ref 135–145)

## 2018-03-18 LAB — CBC WITH DIFFERENTIAL/PLATELET
BASOS ABS: 0 10*3/uL (ref 0.0–0.1)
Basophils Relative: 0.9 % (ref 0.0–3.0)
EOS ABS: 0.1 10*3/uL (ref 0.0–0.7)
Eosinophils Relative: 2.6 % (ref 0.0–5.0)
HCT: 42.1 % (ref 36.0–46.0)
Hemoglobin: 14.1 g/dL (ref 12.0–15.0)
LYMPHS ABS: 1.1 10*3/uL (ref 0.7–4.0)
Lymphocytes Relative: 27.4 % (ref 12.0–46.0)
MCHC: 33.5 g/dL (ref 30.0–36.0)
MCV: 89.8 fl (ref 78.0–100.0)
Monocytes Absolute: 0.5 10*3/uL (ref 0.1–1.0)
Monocytes Relative: 13.1 % — ABNORMAL HIGH (ref 3.0–12.0)
NEUTROS PCT: 56 % (ref 43.0–77.0)
Neutro Abs: 2.3 10*3/uL (ref 1.4–7.7)
PLATELETS: 254 10*3/uL (ref 150.0–400.0)
RBC: 4.69 Mil/uL (ref 3.87–5.11)
RDW: 13 % (ref 11.5–15.5)
WBC: 4.1 10*3/uL (ref 4.0–10.5)

## 2018-03-20 NOTE — Telephone Encounter (Signed)
Please advise 

## 2018-03-23 NOTE — Telephone Encounter (Signed)
6 month f/u appt would be fine and we can recheck lipids

## 2018-03-23 NOTE — Telephone Encounter (Signed)
Patient has already had labs done , no follow up scheduled when would you like for her to follow up . Thanks

## 2018-03-23 NOTE — Telephone Encounter (Signed)
Call pt zocor has been increased to 20mg  She can check WBC when she checks lipids in 3 months. Ensure OV.   There is NO email  She needs to speak with front desk at next visit about DEACTIVAING mychart account

## 2018-03-24 NOTE — Telephone Encounter (Signed)
Left voice mail to call back ok for [facility] to speak to patient 

## 2018-03-25 ENCOUNTER — Encounter: Payer: Self-pay | Admitting: Family

## 2018-03-25 ENCOUNTER — Ambulatory Visit
Admission: RE | Admit: 2018-03-25 | Discharge: 2018-03-25 | Disposition: A | Discharge status: Home / Self Care | Payer: Medicare Other | Source: Ambulatory Visit | Attending: Family | Admitting: Family

## 2018-03-25 DIAGNOSIS — E042 Nontoxic multinodular goiter: Secondary | ICD-10-CM | POA: Diagnosis not present

## 2018-03-25 DIAGNOSIS — E041 Nontoxic single thyroid nodule: Secondary | ICD-10-CM | POA: Diagnosis present

## 2018-03-31 NOTE — Telephone Encounter (Signed)
mychart message sent

## 2018-04-22 ENCOUNTER — Other Ambulatory Visit: Payer: Self-pay | Admitting: Internal Medicine

## 2018-04-22 DIAGNOSIS — Z1231 Encounter for screening mammogram for malignant neoplasm of breast: Secondary | ICD-10-CM

## 2018-05-04 ENCOUNTER — Ambulatory Visit: Payer: Medicare Other | Admitting: Family

## 2018-05-05 ENCOUNTER — Other Ambulatory Visit: Payer: Self-pay | Admitting: Otolaryngology

## 2018-05-05 DIAGNOSIS — R221 Localized swelling, mass and lump, neck: Secondary | ICD-10-CM

## 2018-05-20 ENCOUNTER — Ambulatory Visit
Admission: RE | Admit: 2018-05-20 | Discharge: 2018-05-20 | Disposition: A | Discharge status: Home / Self Care | Payer: Medicare Other | Source: Ambulatory Visit | Attending: Otolaryngology | Admitting: Otolaryngology

## 2018-05-20 DIAGNOSIS — R221 Localized swelling, mass and lump, neck: Secondary | ICD-10-CM

## 2018-05-20 DIAGNOSIS — M47812 Spondylosis without myelopathy or radiculopathy, cervical region: Secondary | ICD-10-CM | POA: Diagnosis not present

## 2018-05-20 LAB — POCT I-STAT CREATININE: Creatinine, Ser: 0.6 mg/dL (ref 0.44–1.00)

## 2018-05-20 MED ORDER — IOHEXOL 300 MG/ML  SOLN
75.0000 mL | Freq: Once | INTRAMUSCULAR | Status: AC | PRN
  Administered 2018-05-20: 75 mL via INTRAVENOUS

## 2018-05-29 ENCOUNTER — Ambulatory Visit
Admission: RE | Admit: 2018-05-29 | Discharge: 2018-05-29 | Disposition: A | Discharge status: Home / Self Care | Payer: Medicare Other | Source: Ambulatory Visit | Attending: Internal Medicine | Admitting: Internal Medicine

## 2018-05-29 DIAGNOSIS — Z1231 Encounter for screening mammogram for malignant neoplasm of breast: Secondary | ICD-10-CM | POA: Insufficient documentation

## 2018-06-08 ENCOUNTER — Ambulatory Visit: Payer: Medicare Other | Admitting: Family

## 2018-07-15 HISTORY — PX: CATARACT EXTRACTION W/ INTRAOCULAR LENS IMPLANT: SHX1309

## 2019-03-10 ENCOUNTER — Other Ambulatory Visit: Payer: Self-pay | Admitting: Family

## 2019-03-10 DIAGNOSIS — E785 Hyperlipidemia, unspecified: Secondary | ICD-10-CM

## 2019-04-08 ENCOUNTER — Other Ambulatory Visit: Payer: Self-pay

## 2019-04-08 DIAGNOSIS — E785 Hyperlipidemia, unspecified: Secondary | ICD-10-CM

## 2019-04-08 MED ORDER — SIMVASTATIN 20 MG PO TABS: ORAL_TABLET | ORAL | 0 refills | Status: DC

## 2019-05-13 ENCOUNTER — Other Ambulatory Visit: Payer: Self-pay | Admitting: *Deleted

## 2019-05-13 DIAGNOSIS — E785 Hyperlipidemia, unspecified: Secondary | ICD-10-CM

## 2019-05-13 MED ORDER — SIMVASTATIN 20 MG PO TABS: ORAL_TABLET | ORAL | 5 refills | Status: DC

## 2019-05-19 ENCOUNTER — Other Ambulatory Visit: Payer: Self-pay | Admitting: Internal Medicine

## 2019-05-19 ENCOUNTER — Other Ambulatory Visit: Payer: Self-pay | Admitting: Family

## 2019-05-19 DIAGNOSIS — Z1231 Encounter for screening mammogram for malignant neoplasm of breast: Secondary | ICD-10-CM

## 2019-06-08 ENCOUNTER — Ambulatory Visit
Admission: RE | Admit: 2019-06-08 | Discharge: 2019-06-08 | Disposition: A | Discharge status: Home / Self Care | Payer: Medicare Other | Source: Ambulatory Visit | Attending: Family | Admitting: Family

## 2019-06-08 DIAGNOSIS — Z1231 Encounter for screening mammogram for malignant neoplasm of breast: Secondary | ICD-10-CM | POA: Diagnosis not present

## 2019-07-28 ENCOUNTER — Encounter: Payer: Medicare Other | Admitting: Family

## 2019-08-26 ENCOUNTER — Ambulatory Visit: Payer: Medicare Other

## 2019-09-17 ENCOUNTER — Other Ambulatory Visit: Payer: Self-pay

## 2019-09-17 ENCOUNTER — Ambulatory Visit (INDEPENDENT_AMBULATORY_CARE_PROVIDER_SITE_OTHER): Payer: Medicare Other | Admitting: Family

## 2019-09-17 ENCOUNTER — Encounter: Payer: Self-pay | Admitting: Family

## 2019-09-17 VITALS — BP 124/62 | HR 65 | Temp 96.4°F | Ht 59.5 in | Wt 116.0 lb

## 2019-09-17 DIAGNOSIS — Z0000 Encounter for general adult medical examination without abnormal findings: Secondary | ICD-10-CM | POA: Diagnosis not present

## 2019-09-17 DIAGNOSIS — M810 Age-related osteoporosis without current pathological fracture: Secondary | ICD-10-CM

## 2019-09-17 DIAGNOSIS — E785 Hyperlipidemia, unspecified: Secondary | ICD-10-CM

## 2019-09-17 LAB — COMPREHENSIVE METABOLIC PANEL
ALT: 19 U/L (ref 0–35)
AST: 23 U/L (ref 0–37)
Albumin: 4.3 g/dL (ref 3.5–5.2)
Alkaline Phosphatase: 29 U/L — ABNORMAL LOW (ref 39–117)
BUN: 14 mg/dL (ref 6–23)
CO2: 28 mEq/L (ref 19–32)
Calcium: 9.6 mg/dL (ref 8.4–10.5)
Chloride: 103 mEq/L (ref 96–112)
Creatinine, Ser: 0.59 mg/dL (ref 0.40–1.20)
GFR: 97.73 mL/min (ref >60.00)
Glucose, Bld: 98 mg/dL (ref 70–99)
Potassium: 3.7 mEq/L (ref 3.5–5.1)
Sodium: 139 mEq/L (ref 135–145)
Total Bilirubin: 0.5 mg/dL (ref 0.2–1.2)
Total Protein: 7.4 g/dL (ref 6.0–8.3)

## 2019-09-17 LAB — LIPID PANEL
Cholesterol: 166 mg/dL (ref 0–200)
HDL: 67.7 mg/dL (ref >39.00)
LDL Cholesterol: 84 mg/dL (ref 0–99)
NonHDL: 98.37
Total CHOL/HDL Ratio: 2
Triglycerides: 74 mg/dL (ref 0.0–149.0)
VLDL: 14.8 mg/dL (ref 0.0–40.0)

## 2019-09-17 LAB — CBC WITH DIFFERENTIAL/PLATELET
Basophils Absolute: 0 10*3/uL (ref 0.0–0.1)
Basophils Relative: 0.5 % (ref 0.0–3.0)
Eosinophils Absolute: 0.1 10*3/uL (ref 0.0–0.7)
Eosinophils Relative: 1.5 % (ref 0.0–5.0)
HCT: 42.3 % (ref 36.0–46.0)
Hemoglobin: 14.2 g/dL (ref 12.0–15.0)
Lymphocytes Relative: 27 % (ref 12.0–46.0)
Lymphs Abs: 1.1 10*3/uL (ref 0.7–4.0)
MCHC: 33.5 g/dL (ref 30.0–36.0)
MCV: 89.4 fl (ref 78.0–100.0)
Monocytes Absolute: 0.4 10*3/uL (ref 0.1–1.0)
Monocytes Relative: 8.7 % (ref 3.0–12.0)
Neutro Abs: 2.5 10*3/uL (ref 1.4–7.7)
Neutrophils Relative %: 62.3 % (ref 43.0–77.0)
Platelets: 233 10*3/uL (ref 150.0–400.0)
RBC: 4.73 Mil/uL (ref 3.87–5.11)
RDW: 13.1 % (ref 11.5–15.5)
WBC: 4.1 10*3/uL (ref 4.0–10.5)

## 2019-09-17 LAB — TSH: TSH: 1.12 u[IU]/mL (ref 0.35–4.50)

## 2019-09-17 NOTE — Assessment & Plan Note (Signed)
Stable, following with endocrine for this.  Will follow

## 2019-09-17 NOTE — Patient Instructions (Addendum)
Tdap ( tetanus) vaccine due at local pharmacy.   Stay safe   Health Maintenance for Postmenopausal Women Menopause is a normal process in which your ability to get pregnant comes to an end. This process happens slowly over many months or years, usually between the ages of 34 and 59. Menopause is complete when you have missed your menstrual periods for 12 months. It is important to talk with your health care provider about some of the most common conditions that affect women after menopause (postmenopausal women). These include heart disease, cancer, and bone loss (osteoporosis). Adopting a healthy lifestyle and getting preventive care can help to promote your health and wellness. The actions you take can also lower your chances of developing some of these common conditions. What should I know about menopause? During menopause, you may get a number of symptoms, such as:  Hot flashes. These can be moderate or severe.  Night sweats.  Decrease in sex drive.  Mood swings.  Headaches.  Tiredness.  Irritability.  Memory problems.  Insomnia. Choosing to treat or not to treat these symptoms is a decision that you make with your health care provider. Do I need hormone replacement therapy?  Hormone replacement therapy is effective in treating symptoms that are caused by menopause, such as hot flashes and night sweats.  Hormone replacement carries certain risks, especially as you become older. If you are thinking about using estrogen or estrogen with progestin, discuss the benefits and risks with your health care provider. What is my risk for heart disease and stroke? The risk of heart disease, heart attack, and stroke increases as you age. One of the causes may be a change in the body's hormones during menopause. This can affect how your body uses dietary fats, triglycerides, and cholesterol. Heart attack and stroke are medical emergencies. There are many things that you can do to help  prevent heart disease and stroke. Watch your blood pressure  High blood pressure causes heart disease and increases the risk of stroke. This is more likely to develop in people who have high blood pressure readings, are of African descent, or are overweight.  Have your blood pressure checked: ? Every 3-5 years if you are 40-36 years of age. ? Every year if you are 76 years old or older. Eat a healthy diet   Eat a diet that includes plenty of vegetables, fruits, low-fat dairy products, and lean protein.  Do not eat a lot of foods that are high in solid fats, added sugars, or sodium. Get regular exercise Get regular exercise. This is one of the most important things you can do for your health. Most adults should:  Try to exercise for at least 150 minutes each week. The exercise should increase your heart rate and make you sweat (moderate-intensity exercise).  Try to do strengthening exercises at least twice each week. Do these in addition to the moderate-intensity exercise.  Spend less time sitting. Even light physical activity can be beneficial. Other tips  Work with your health care provider to achieve or maintain a healthy weight.  Do not use any products that contain nicotine or tobacco, such as cigarettes, e-cigarettes, and chewing tobacco. If you need help quitting, ask your health care provider.  Know your numbers. Ask your health care provider to check your cholesterol and your blood sugar (glucose). Continue to have your blood tested as directed by your health care provider. Do I need screening for cancer? Depending on your health history and family history,  you may need to have cancer screening at different stages of your life. This may include screening for:  Breast cancer.  Cervical cancer.  Lung cancer.  Colorectal cancer. What is my risk for osteoporosis? After menopause, you may be at increased risk for osteoporosis. Osteoporosis is a condition in which bone  destruction happens more quickly than new bone creation. To help prevent osteoporosis or the bone fractures that can happen because of osteoporosis, you may take the following actions:  If you are 54-92 years old, get at least 1,000 mg of calcium and at least 600 mg of vitamin D per day.  If you are older than age 90 but younger than age 71, get at least 1,200 mg of calcium and at least 600 mg of vitamin D per day.  If you are older than age 84, get at least 1,200 mg of calcium and at least 800 mg of vitamin D per day. Smoking and drinking excessive alcohol increase the risk of osteoporosis. Eat foods that are rich in calcium and vitamin D, and do weight-bearing exercises several times each week as directed by your health care provider. How does menopause affect my mental health? Depression may occur at any age, but it is more common as you become older. Common symptoms of depression include:  Low or sad mood.  Changes in sleep patterns.  Changes in appetite or eating patterns.  Feeling an overall lack of motivation or enjoyment of activities that you previously enjoyed.  Frequent crying spells. Talk with your health care provider if you think that you are experiencing depression. General instructions See your health care provider for regular wellness exams and vaccines. This may include:  Scheduling regular health, dental, and eye exams.  Getting and maintaining your vaccines. These include: ? Influenza vaccine. Get this vaccine each year before the flu season begins. ? Pneumonia vaccine. ? Shingles vaccine. ? Tetanus, diphtheria, and pertussis (Tdap) booster vaccine. Your health care provider may also recommend other immunizations. Tell your health care provider if you have ever been abused or do not feel safe at home. Summary  Menopause is a normal process in which your ability to get pregnant comes to an end.  This condition causes hot flashes, night sweats, decreased  interest in sex, mood swings, headaches, or lack of sleep.  Treatment for this condition may include hormone replacement therapy.  Take actions to keep yourself healthy, including exercising regularly, eating a healthy diet, watching your weight, and checking your blood pressure and blood sugar levels.  Get screened for cancer and depression. Make sure that you are up to date with all your vaccines. This information is not intended to replace advice given to you by your health care provider. Make sure you discuss any questions you have with your health care provider. Document Revised: 06/24/2018 Document Reviewed: 06/24/2018 Elsevier Patient Education  2020 [employer].

## 2019-09-17 NOTE — Assessment & Plan Note (Signed)
Stable. Pending lipid panel, continue Zocor.

## 2019-09-17 NOTE — Assessment & Plan Note (Signed)
Up-to-date on mammogram.  Clinical breast exam performed . deferred colonoscopy and pelvic exam based on patient preference, age.  Offered risk stratification  referral to cardiology, patient declines at this time as she does not feel like it necessary.  She will let me know if changes her mind.

## 2019-09-17 NOTE — Progress Notes (Signed)
Subjective:    Patient ID: Janet Shields, female    DOB: 04-Oct-1935, 84 y.o.   MRN: [id number]  CC: Janet Shields is a 84 y.o. female who presents today for physical exam.    HPI: Feels well, no complaints  Continues to walk 3 miles every day on her treadmill.  She does do stretching exercises as well.  No chest pain or shortness of breath.  Many years ago she had a normal stress test.  Osteoporosis- Following with endocrine now , prior had followed with rheumatology,on  Prolia.  HLD- compliant with zocor    Colorectal Cancer Screening: 13 years ago Breast Cancer Screening: Mammogram UTD Cervical Cancer Screening: No pelvic pain, bleeding.  Bone Health screening/DEXA for 65+: UTD, 01/2019.   Immunizations       Tetanus - due        Labs: Screening labs today. Exercise: Gets regular exercise.  Alcohol use: occasional Smoking/tobacco use: Nonsmoker.    HISTORY:  Past Medical History:  Diagnosis Date  . History of colon polyps   . Hyperlipidemia   . Osteoporosis     Past Surgical History:  Procedure Laterality Date  . BREAST BIOPSY  2007   CORE W/CLIP - NEG   Family History  Problem Relation Age of Onset  . Arthritis Mother   . Alcohol abuse Father   . Heart disease Father 60       CAD - died  . Hyperlipidemia Brother   . Diabetes Brother   . Hypertension Brother   . Diabetes Brother   . Hyperlipidemia Brother   . CAD Brother        quadruple bypass  . Breast cancer Neg Hx       ALLERGIES: Tramadol  Current Outpatient Medications on File Prior to Visit  Medication Sig Dispense Refill  . Calcium Citrate-Vitamin D (CAL-CITRATE PLUS VITAMIN D PO) Take 1 tablet by mouth daily.    Marland Kitchen denosumab (PROLIA) 60 MG/ML SOLN injection Inject 60 mg into the skin every 6 (six) months. Administer in upper arm, thigh, or abdomen    . Multiple Vitamin (MULTIVITAMIN) tablet Take 1 tablet by mouth daily.    . Multiple Vitamins-Minerals (OCUVITE EYE HEATLH GUMMIES PO)  Take by mouth daily.    . simvastatin (ZOCOR) 20 MG tablet TAKE 1 TABLET(20 MG) BY MOUTH AT BEDTIME 30 tablet 5   No current facility-administered medications on file prior to visit.    Social History   Tobacco Use  . Smoking status: Never Smoker  . Smokeless tobacco: Never Used  Substance Use Topics  . Alcohol use: Yes    Comment: Occasional wine with dinner  . Drug use: No    Review of Systems  Constitutional: Negative for chills, fever and unexpected weight change.  HENT: Negative for congestion.   Respiratory: Negative for cough.   Cardiovascular: Negative for chest pain, palpitations and leg swelling.  Gastrointestinal: Negative for nausea and vomiting.  Genitourinary: Negative for pelvic pain.  Musculoskeletal: Negative for arthralgias and myalgias.  Skin: Negative for rash.  Neurological: Negative for headaches.  Hematological: Negative for adenopathy.  Psychiatric/Behavioral: Negative for confusion.      Objective:    BP 124/62   Pulse 65   Temp (!) 96.4 F (35.8 C) (Temporal)   Ht 4' 11.5" (1.511 m)   Wt 116 lb (52.6 kg)   SpO2 99%   BMI 23.04 kg/m   BP Readings from Last 3 Encounters:  09/17/19 124/62  02/25/18 114/70  10/22/17 130/72   Wt Readings from Last 3 Encounters:  09/17/19 116 lb (52.6 kg)  02/25/18 114 lb 4 oz (51.8 kg)  10/22/17 116 lb 8 oz (52.8 kg)    Physical Exam Vitals reviewed.  Constitutional:      Appearance: She is well-developed.  Eyes:     Conjunctiva/sclera: Conjunctivae normal.  Neck:     Thyroid: No thyroid mass or thyromegaly.  Cardiovascular:     Rate and Rhythm: Normal rate and regular rhythm.     Pulses: Normal pulses.     Heart sounds: Normal heart sounds.  Pulmonary:     Effort: Pulmonary effort is normal.     Breath sounds: Normal breath sounds. No wheezing, rhonchi or rales.  Chest:     Breasts: Breasts are symmetrical.        Right: No inverted nipple, mass, nipple discharge, skin change or tenderness.          Left: No inverted nipple, mass, nipple discharge, skin change or tenderness.  Lymphadenopathy:     Head:     Right side of head: No submental, submandibular, tonsillar, preauricular, posterior auricular or occipital adenopathy.     Left side of head: No submental, submandibular, tonsillar, preauricular, posterior auricular or occipital adenopathy.     Cervical: No cervical adenopathy.     Right cervical: No superficial, deep or posterior cervical adenopathy.    Left cervical: No superficial, deep or posterior cervical adenopathy.  Skin:    General: Skin is warm and dry.  Neurological:     Mental Status: She is alert.  Psychiatric:        Speech: Speech normal.        Behavior: Behavior normal.        Thought Content: Thought content normal.        Assessment & Plan:   Problem List Items Addressed This Visit      Musculoskeletal and Integument   Osteoporosis    Stable, following with endocrine for this.  Will follow        Other   HLD (hyperlipidemia)    Stable. Pending lipid panel, continue Zocor.      Routine physical examination - Primary    Up-to-date on mammogram.  Clinical breast exam performed . deferred colonoscopy and pelvic exam based on patient preference, age.  Offered risk stratification  referral to cardiology, patient declines at this time as she does not feel like it necessary.  She will let me know if changes her mind.      Relevant Orders   TSH   CBC with Differential/Platelet   Comprehensive metabolic panel   Lipid panel       I have discontinued Janet Shields's triamcinolone and Biotin. I am also having her maintain her denosumab, multivitamin, Calcium Citrate-Vitamin D (CAL-CITRATE PLUS VITAMIN D PO), Multiple Vitamins-Minerals (OCUVITE EYE HEATLH GUMMIES PO), and simvastatin.   No orders of the defined types were placed in this encounter.   Return precautions given.   Risks, benefits, and alternatives of the medications and  treatment plan prescribed today were discussed, and patient expressed understanding.   Education regarding symptom management and diagnosis given to patient on AVS.   Continue to follow with Janet Grana, FNP for routine health maintenance.   Janet Shields and I agreed with plan.   [name], FNP

## 2019-10-22 ENCOUNTER — Ambulatory Visit (INDEPENDENT_AMBULATORY_CARE_PROVIDER_SITE_OTHER): Payer: Medicare Other

## 2019-10-22 VITALS — Ht 59.5 in | Wt 116.0 lb

## 2019-10-22 DIAGNOSIS — Z0000 Encounter for general adult medical examination without abnormal findings: Secondary | ICD-10-CM | POA: Diagnosis not present

## 2019-10-22 NOTE — Progress Notes (Addendum)
Subjective:   Janet Shields is a 84 y.o. female who presents for Medicare Annual (Subsequent) preventive examination.  Review of Systems:  No ROS.  Medicare Wellness Virtual Visit.  Visual/audio telehealth visit, UTA vital signs.   Ht/Wt provided. See social history for additional risk factors.   Cardiac Risk Factors include: advanced age (>59men, >77 women)     Objective:     Vitals: Ht 4' 11.5" (1.511 m)   Wt 116 lb (52.6 kg)   BMI 23.04 kg/m   Body mass index is 23.04 kg/m.  Advanced Directives 10/22/2019 10/17/2017 05/04/2015  Does Patient Have a Medical Advance Directive? Yes Yes Yes  Type of Estate agent of [place];Living will Living will Healthcare Power of [place];Living will  Does patient want to make changes to medical advance directive? No - Patient declined No - Patient declined -  Copy of Healthcare Power of Attorney in Chart? No - copy requested - No - copy requested    Tobacco Social History   Tobacco Use  Smoking Status Never Smoker  Smokeless Tobacco Never Used     Counseling given: Not Answered   Clinical Intake:  Pre-visit preparation completed: Yes        Diabetes: No  How often do you need to have someone help you when you read instructions, pamphlets, or other written materials from your doctor or pharmacy?: 1 - Never  Interpreter Needed?: No     Past Medical History:  Diagnosis Date  . History of colon polyps   . Hyperlipidemia   . Osteoporosis    Past Surgical History:  Procedure Laterality Date  . BREAST BIOPSY  2007   CORE W/CLIP - NEG   Family History  Problem Relation Age of Onset  . Arthritis Mother   . Alcohol abuse Father   . Heart disease Father 20       CAD - died  . Hyperlipidemia Brother   . Diabetes Brother   . Hypertension Brother   . Diabetes Brother   . Hyperlipidemia Brother   . CAD Brother        quadruple bypass  . Diabetes Brother   . Breast cancer Neg Hx    Social  History   Socioeconomic History  . Marital status: Single    Spouse name: Not on file  . Number of children: 1  . Years of education: 37  . Highest education level: Not on file  Occupational History  . 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 Smoking status: Never Smoker  . Smokeless tobacco: Never Used  Substance and Sexual Activity  . Alcohol use: Yes    Comment: Occasional wine with dinner  . Drug use: No  . Sexual activity: Not Currently  Other Topics Concern  . Not on file  Social History Narrative   Hobbies: flower gardening, read, travel   Caffeine: 4 cups of coffee   Exercise: walk 3 miles on treadmill 3 a week, resistance exercises [employer]   Social Determinants of Health   Financial Resource Strain: Low Risk   . Difficulty of Paying Living Expenses: Not hard at all  Food Insecurity: No Food Insecurity  . Worried About Programme researcher, broadcasting/film/video in the Last Year: Never true  . Ran Out of Food in the Last Year: Never true  Transportation Needs: No Transportation Needs  . Lack of Transportation (Medical): No  . Lack of Transportation (Non-Medical): No  Physical Activity: Sufficiently  Active  . Days of Exercise per Week: 7 days  . Minutes of Exercise per Session: 60 min  Stress: No Stress Concern Present  . Feeling of Stress : Not at all  Social Connections: Unknown  . Frequency of Communication with Friends and Family: More than three times a week  . Frequency of Social Gatherings with Friends and Family: Not on file  . Attends Religious Services: More than 4 times per year  . Active Member of Clubs or Organizations: Yes  . Attends Banker Meetings: More than 4 times per year  . Marital Status: Not on file    Outpatient Encounter Medications as of 10/22/2019  Medication Sig  . Calcium Citrate-Vitamin D (CAL-CITRATE PLUS VITAMIN D PO) Take 1 tablet by mouth daily.  Marland Kitchen denosumab (PROLIA) 60 MG/ML SOLN injection Inject 60 mg into the  skin every 6 (six) months. Administer in upper arm, thigh, or abdomen  . Multiple Vitamin (MULTIVITAMIN) tablet Take 1 tablet by mouth daily.  . simvastatin (ZOCOR) 20 MG tablet TAKE 1 TABLET(20 MG) BY MOUTH AT BEDTIME  . [DISCONTINUED] Multiple Vitamins-Minerals (OCUVITE EYE HEATLH GUMMIES PO) Take by mouth daily.   No facility-administered encounter medications on file as of 10/22/2019.    Activities of Daily Living In your present state of health, do you have any difficulty performing the following activities: 10/22/2019 09/17/2019  Hearing? N N  Vision? N N  Difficulty concentrating or making decisions? N N  Walking or climbing stairs? N N  Dressing or bathing? N N  Doing errands, shopping? N N  Preparing Food and eating ? N -  Using the Toilet? N -  In the past six months, have you accidently leaked urine? N -  Do you have problems with loss of bowel control? N -  Managing your Medications? N -  Managing your Finances? N -  Housekeeping or managing your Housekeeping? N -  Some recent data might be hidden    Patient Care Team: Allegra Grana, FNP as PCP - General (Family Medicine)    Assessment:   This is a routine wellness examination for Janet Shields.  Nurse connected with patient 10/22/19 at  1:30 PM EDT by a telephone enabled telemedicine application and verified that I am speaking with the correct person using two identifiers. Patient stated full name and DOB. Patient gave permission to continue with virtual visit. Patient's location was at home and Nurse's location was at [place] office.   Patient is alert and oriented x3. Patient denies difficulty focusing or concentrating. Patient likes to read and stays active for brain health.   Health Maintenance Due: -Tdap vaccine- discussed; to be completed with doctor in visit or local pharmacy. See completed HM at the end of note.   Eye: Visual acuity not assessed. Virtual visit. Followed by their ophthalmologist.  Dental:  Visits every 3 months.    Hearing: Demonstrates normal hearing during visit.  Safety:  Patient feels safe at home- yes Patient does have smoke detectors at home- yes Patient does wear sunscreen or protective clothing when in direct sunlight - yes Patient does wear seat belt when in a moving vehicle - yes Patient drives- yes Adequate lighting in walkways free from debris- yes Grab bars and handrails used as appropriate- yes Ambulates with an assistive device- no Cell phone on person when ambulating outside of the home-yes  Social: Alcohol intake - yes      Smoking history- never   Smokers in home? none Illicit  drug use? none  Medication: Taking as directed and without issues.  Self managed - yes   Covid-19: Precautions and sickness symptoms discussed. Wears mask, social distancing, hand hygiene as appropriate.   Activities of Daily Living Patient denies needing assistance with: household chores, feeding themselves, getting from bed to chair, getting to the toilet, bathing/showering, dressing, managing money, or preparing meals.   Discussed the importance of a healthy diet, water intake and the benefits of aerobic exercise.   Physical activity- gardening, 3 miles on treadmill 45-50 minutes, television stretching class 30 miinutes, fit bit worn and averages 30 miles weekly.  Diet:  Regular; monitors protein intake Water: good intake Caffeine: 1 large mug of coffee  Other Providers Patient Care Team: Burnard Hawthorne, FNP as PCP - General (Family Medicine)  Exercise Activities and Dietary recommendations Current Exercise Habits: Home exercise routine, Type of exercise: treadmill;stretching, Time (Minutes): 50, Intensity: Moderate  Goals      Patient Stated   . Start the book club again (pt-stated)       Fall Risk Fall Risk  10/22/2019 09/17/2019 02/14/2016 05/04/2015 03/24/2014  Falls in the past year? 0 0 No Yes No  Number falls in past yr: - - - 1 -  Injury with  Fall? - - - Yes -  Risk for fall due to : - - - Other (Comment) -  Risk for fall due to: Comment - - - Loss footing. -  Follow up Falls evaluation completed Falls evaluation completed - Falls prevention discussed;Education provided -  Comment - - - CT scan completed.  XRAY of L ankle complete. -    Timed Get Up and Go performed:no, virtual visits  Depression Screen PHQ 2/9 Scores 10/22/2019 09/17/2019 02/14/2016 05/04/2015  PHQ - 2 Score 0 1 0 0  PHQ- 9 Score 0 3 - -     Cognitive Function MMSE - Mini Mental State Exam 10/22/2019 05/04/2015  Not completed: Unable to complete -  Orientation to time - 5  Orientation to Place - 5  Registration - 3  Attention/ Calculation - 5  Recall - 3  Language- name 2 objects - 2  Language- repeat - 1  Language- follow 3 step command - 3  Language- read & follow direction - 1  Write a sentence - 1  Copy design - 1  Total score - 30        Immunization History  Administered Date(s) Administered  . DTaP 10/17/2008  . Fluad Quad(high Dose 65+) 03/30/2019  . Influenza, High Dose Seasonal PF 04/28/2017  . Influenza-Unspecified 04/18/2016, 04/15/2017  . PFIZER SARS-COV-2 Vaccination 08/10/2019, 08/31/2019  . Pneumococcal Conjugate-13 03/24/2014  . Pneumococcal Polysaccharide-23 12/23/2006  . Zoster 12/23/2006   Screening Tests Health Maintenance  Topic Date Due  . TETANUS/TDAP  10/14/2016  . INFLUENZA VACCINE  02/13/2020  . DEXA SCAN  Completed  . PNA vac Low Risk Adult  Completed      Plan:   Keep all routine maintenance appointments.   Medicare Attestation I have personally reviewed: The patient's medical and social history Their use of alcohol, tobacco or illicit drugs Their current medications and supplements The patient's functional ability including ADLs,fall risks, home safety risks, cognitive, and hearing and visual impairment Diet and physical activities Evidence for depression   I have reviewed and discussed with patient  certain preventive protocols, quality metrics, and best practice recommendations.      Varney Biles, LPN  09/17/4654  Agree with plan. Mable Paris,  NP

## 2019-10-22 NOTE — Patient Instructions (Addendum)
  Janet Shields , Thank you for taking time to come for your Medicare Wellness Visit. I appreciate your ongoing commitment to your health goals. Please review the following plan we discussed and let me know if I can assist you in the future.   These are the goals we discussed: Goals      Patient Stated   . Start the book club again (pt-stated)       This is a list of the screening recommended for you and due dates:  Health Maintenance  Topic Date Due  . Tetanus Vaccine  10/14/2016  . Flu Shot  02/13/2020  . DEXA scan (bone density measurement)  Completed  . Pneumonia vaccines  Completed

## 2019-11-08 ENCOUNTER — Other Ambulatory Visit: Payer: Self-pay | Admitting: Family

## 2019-11-08 DIAGNOSIS — E785 Hyperlipidemia, unspecified: Secondary | ICD-10-CM

## 2019-12-06 ENCOUNTER — Other Ambulatory Visit: Payer: Self-pay

## 2019-12-06 ENCOUNTER — Encounter: Payer: Self-pay | Admitting: Nurse Practitioner

## 2019-12-06 ENCOUNTER — Ambulatory Visit (INDEPENDENT_AMBULATORY_CARE_PROVIDER_SITE_OTHER): Payer: Medicare Other | Admitting: Nurse Practitioner

## 2019-12-06 VITALS — BP 130/72 | HR 91 | Temp 97.1°F | Ht 59.5 in | Wt 116.2 lb

## 2019-12-06 DIAGNOSIS — R300 Dysuria: Secondary | ICD-10-CM | POA: Insufficient documentation

## 2019-12-06 LAB — POCT URINALYSIS DIPSTICK
Bilirubin, UA: NEGATIVE
Blood, UA: POSITIVE
Glucose, UA: NEGATIVE
Ketones, UA: NEGATIVE
Nitrite, UA: NEGATIVE
Protein, UA: POSITIVE — AB
Spec Grav, UA: 1.01 (ref 1.010–1.025)
Urobilinogen, UA: NEGATIVE E.U./dL — AB
pH, UA: 6 (ref 5.0–8.0)

## 2019-12-06 LAB — URINALYSIS, MICROSCOPIC ONLY: RBC / HPF: NONE SEEN (ref 0–?)

## 2019-12-06 MED ORDER — CEFDINIR 300 MG PO CAPS: 300.0000 mg | ORAL_CAPSULE | Freq: Two times a day (BID) | ORAL | 0 refills | Status: DC

## 2019-12-06 NOTE — Patient Instructions (Addendum)
It was nice to meet you today.  We have sent off your urine for a culture and will notify you if the antibiotic needs changed.  Begin the antibiotic and take as directed. Pick up a probiotic- Florastor and take directed to prevent diarrhea.   Dysuria Dysuria is pain or discomfort while urinating. The pain or discomfort may be felt in the part of your body that drains urine from the bladder (urethra) or in the surrounding tissue of the genitals. The pain may also be felt in the groin area, lower abdomen, or lower back. You may have to urinate frequently or have the sudden feeling that you have to urinate (urgency). Dysuria can affect both men and women, but it is more common in women. Dysuria can be caused by many different things, including:  Urinary tract infection.  Kidney stones or bladder stones.  Certain sexually transmitted infections (STIs), such as chlamydia.  Dehydration.  Inflammation of the tissues of the vagina.  Use of certain medicines.  Use of certain soaps or scented products that cause irritation. Follow these instructions at home: General instructions  Watch your condition for any changes.  Urinate often. Avoid holding urine for long periods of time.  After a bowel movement or urination, women should cleanse from front to back, using each tissue only once.  Urinate after sexual intercourse.  Keep all follow-up visits as told by your health care provider. This is important.  If you had any tests done to find the cause of dysuria, it is up to you to get your test results. Ask your health care provider, or the department that is doing the test, when your results will be ready. Eating and drinking   Drink enough fluid to keep your urine pale yellow.  Avoid caffeine, tea, and alcohol. They can irritate the bladder and make dysuria worse. In men, alcohol may irritate the prostate. Medicines  Take over-the-counter and prescription medicines only as told by your  health care provider.  If you were prescribed an antibiotic medicine, take it as told by your health care provider. Do not stop taking the antibiotic even if you start to feel better. Contact a health care provider if:  You have a fever.  You develop pain in your back or sides.  You have nausea or vomiting.  You have blood in your urine.  You are not urinating as often as you usually do. Get help right away if:  Your pain is severe and not relieved with medicines.  You cannot eat or drink without vomiting.  You are confused.  You have a rapid heartbeat while at rest.  You have shaking or chills.  You feel extremely weak. Summary  Dysuria is pain or discomfort while urinating. Many different conditions can lead to dysuria.  If you have dysuria, you may have to urinate frequently or have the sudden feeling that you have to urinate (urgency).  Watch your condition for any changes. Keep all follow-up visits as told by your health care provider.  Make sure that you urinate often and drink enough fluid to keep your urine pale yellow. This information is not intended to replace advice given to you by your health care provider. Make sure you discuss any questions you have with your health care provider. Document Revised: 06/13/2017 Document Reviewed: 04/17/2017 Elsevier Patient Education  2020 [employer]  Urinary Tract Infection, Adult  A urinary tract infection (UTI) is an infection of any part of the urinary tract. The urinary  tract includes the kidneys, ureters, bladder, and urethra. These organs make, store, and get rid of urine in the body. Your health care provider may use other names to describe the infection. An upper UTI affects the ureters and kidneys (pyelonephritis). A lower UTI affects the bladder (cystitis) and urethra (urethritis). What are the causes? Most urinary tract infections are caused by bacteria in your genital area, around the entrance to your urinary  tract (urethra). These bacteria grow and cause inflammation of your urinary tract. What increases the risk? You are more likely to develop this condition if:  You have a urinary catheter that stays in place (indwelling).  You are not able to control when you urinate or have a bowel movement (you have incontinence).  You are female and you: ? Use a spermicide or diaphragm for birth control. ? Have low estrogen levels. ? Are pregnant.  You have certain genes that increase your risk (genetics).  You are sexually active.  You take antibiotic medicines.  You have a condition that causes your flow of urine to slow down, such as: ? An enlarged prostate, if you are female. ? Blockage in your urethra (stricture). ? A kidney stone. ? A nerve condition that affects your bladder control (neurogenic bladder). ? Not getting enough to drink, or not urinating often.  You have certain medical conditions, such as: ? Diabetes. ? A weak disease-fighting system (immunesystem). ? Sickle cell disease. ? Gout. ? Spinal cord injury. What are the signs or symptoms? Symptoms of this condition include:  Needing to urinate right away (urgently).  Frequent urination or passing small amounts of urine frequently.  Pain or burning with urination.  Blood in the urine.  Urine that smells bad or unusual.  Trouble urinating.  Cloudy urine.  Vaginal discharge, if you are female.  Pain in the abdomen or the lower back. You may also have:  Vomiting or a decreased appetite.  Confusion.  Irritability or tiredness.  A fever.  Diarrhea. The first symptom in older adults may be confusion. In some cases, they may not have any symptoms until the infection has worsened. How is this diagnosed? This condition is diagnosed based on your medical history and a physical exam. You may also have other tests, including:  Urine tests.  Blood tests.  Tests for sexually transmitted infections (STIs). If  you have had more than one UTI, a cystoscopy or imaging studies may be done to determine the cause of the infections. How is this treated? Treatment for this condition includes:  Antibiotic medicine.  Over-the-counter medicines to treat discomfort.  Drinking enough water to stay hydrated. If you have frequent infections or have other conditions such as a kidney stone, you may need to see a health care provider who specializes in the urinary tract (urologist). In rare cases, urinary tract infections can cause sepsis. Sepsis is a life-threatening condition that occurs when the body responds to an infection. Sepsis is treated in the hospital with IV antibiotics, fluids, and other medicines. Follow these instructions at home:  Medicines  Take over-the-counter and prescription medicines only as told by your health care provider.  If you were prescribed an antibiotic medicine, take it as told by your health care provider. Do not stop using the antibiotic even if you start to feel better. General instructions  Make sure you: ? Empty your bladder often and completely. Do not hold urine for long periods of time. ? Empty your bladder after sex. ? Wipe from front to  back after a bowel movement if you are female. Use each tissue one time when you wipe.  Drink enough fluid to keep your urine pale yellow.  Keep all follow-up visits as told by your health care provider. This is important. Contact a health care provider if:  Your symptoms do not get better after 1-2 days.  Your symptoms go away and then return. Get help right away if you have:  Severe pain in your back or your lower abdomen.  A fever.  Nausea or vomiting. Summary  A urinary tract infection (UTI) is an infection of any part of the urinary tract, which includes the kidneys, ureters, bladder, and urethra.  Most urinary tract infections are caused by bacteria in your genital area, around the entrance to your urinary tract  (urethra).  Treatment for this condition often includes antibiotic medicines.  If you were prescribed an antibiotic medicine, take it as told by your health care provider. Do not stop using the antibiotic even if you start to feel better.  Keep all follow-up visits as told by your health care provider. This is important. This information is not intended to replace advice given to you by your health care provider. Make sure you discuss any questions you have with your health care provider. Document Revised: 06/18/2018 Document Reviewed: 01/08/2018 Elsevier Patient Education  2020 [employer].

## 2019-12-06 NOTE — Progress Notes (Signed)
Established Patient Office Visit  Subjective:  Patient ID: Janet Shields, female    DOB: 1936/01/20  Age: 84 y.o. MRN: [id number]  CC:  Chief Complaint  Patient presents with  . Acute Visit    UTI    HPI Elice Crigger is a 84 yo with hx of osteoporosis and hyperlipidemia presents for urgency, frequency and burning onset maybe Thurs, but definitely Friday. She had a miserable weekend. No fever/chill/N/V/back pain,  but is feeling mild constipation - not normal for her. She has treated this with drinking extra fluids and cranberry juice. Her last UTI was about 17 years ago. She has had no triggers that she is aware, no long car trips, swimming, instrumentation, or sexual activity. No travel, nursing home or hospitalization.   Past Medical History:  Diagnosis Date  . History of colon polyps   . Hyperlipidemia   . Osteoporosis     Past Surgical History:  Procedure Laterality Date  . BREAST BIOPSY  2007   CORE W/CLIP - NEG    Family History  Problem Relation Age of Onset  . Arthritis Mother   . Alcohol abuse Father   . Heart disease Father 52       CAD - died  . Hyperlipidemia Brother   . Diabetes Brother   . Hypertension Brother   . Diabetes Brother   . Hyperlipidemia Brother   . CAD Brother        quadruple bypass  . Diabetes Brother   . Breast cancer Neg Hx     Social History   Socioeconomic History  . Marital status: Single    Spouse name: Not on file  . Number of children: 1  . Years of education: 61  . Highest education level: Not on file  Occupational History  . 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 Smoking status: Never Smoker  . Smokeless tobacco: Never Used  Substance and Sexual Activity  . Alcohol use: Yes    Comment: Occasional wine with dinner  . Drug use: No  . Sexual activity: Not Currently  Other Topics Concern  . Not on file  Social History Narrative   Hobbies: flower gardening, read, travel   Caffeine: 4 cups of coffee   Exercise: walk 3 miles on treadmill 3 a week, resistance exercises [employer]   Social Determinants of Health   Financial Resource Strain: Low Risk   . Difficulty of Paying Living Expenses: Not hard at all  Food Insecurity: No Food Insecurity  . Worried About Programme researcher, broadcasting/film/video in the Last Year: Never true  . Ran Out of Food in the Last Year: Never true  Transportation Needs: No Transportation Needs  . Lack of Transportation (Medical): No  . Lack of Transportation (Non-Medical): No  Physical Activity: Sufficiently Active  . Days of Exercise per Week: 7 days  . Minutes of Exercise per Session: 60 min  Stress: No Stress Concern Present  . Feeling of Stress : Not at all  Social Connections: Unknown  . Frequency of Communication with Friends and Family: More than three times a week  . Frequency of Social Gatherings with Friends and Family: Not on file  . Attends Religious Services: More than 4 times per year  . Active Member of Clubs or Organizations: Yes  . Attends Banker Meetings: More than 4 times per year  . Marital Status: Not on file  Intimate Partner Violence:   . Fear  of Current or Ex-Partner:   . Emotionally Abused:   Marland Kitchen Physically Abused:   . Sexually Abused:     Outpatient Medications Prior to Visit  Medication Sig Dispense Refill  . Calcium Citrate-Vitamin D (CAL-CITRATE PLUS VITAMIN D PO) Take 1 tablet by mouth daily.    Marland Kitchen denosumab (PROLIA) 60 MG/ML SOLN injection Inject 60 mg into the skin every 6 (six) months. Administer in upper arm, thigh, or abdomen    . Multiple Vitamin (MULTIVITAMIN) tablet Take 1 tablet by mouth daily.    . simvastatin (ZOCOR) 20 MG tablet TAKE 1 TABLET(20 MG) BY MOUTH AT BEDTIME 30 tablet 5  . simvastatin (ZOCOR) 20 MG tablet Take by mouth.     No facility-administered medications prior to visit.    Allergies  Allergen Reactions  . Tramadol Nausea And Vomiting     Review of Systems    Constitutional: Negative for chills, fever and unexpected weight change.  HENT: Negative for congestion and sinus pain.   Respiratory: Negative for cough and shortness of breath.   Cardiovascular: Negative for chest pain.  Gastrointestinal: Positive for constipation. Negative for abdominal pain, diarrhea, nausea and vomiting.  Allergic/Immunologic: Negative.       Objective:    Physical Exam  Constitutional: She is oriented to person, place, and time. She appears well-developed and well-nourished.  HENT:  Head: Normocephalic.  Cardiovascular: Normal rate and regular rhythm.  Pulmonary/Chest: Effort normal and breath sounds normal.  Abdominal: Soft. There is no abdominal tenderness.  Musculoskeletal:        General: Normal range of motion.     Cervical back: Normal range of motion and neck supple.  Neurological: She is alert and oriented to person, place, and time.  Skin: Skin is warm and dry. No rash noted.  Psychiatric: She has a normal mood and affect. Her behavior is normal.  Vitals reviewed.   BP 130/72 (BP Location: Left Arm, Patient Position: Sitting, Cuff Size: Small)   Pulse 91   Temp (!) 97.1 F (36.2 C) (Skin)   Ht 4' 11.5" (1.511 m)   Wt 116 lb 3.2 oz (52.7 kg)   SpO2 98%   BMI 23.08 kg/m  Wt Readings from Last 3 Encounters:  12/06/19 116 lb 3.2 oz (52.7 kg)  10/22/19 116 lb (52.6 kg)  09/17/19 116 lb (52.6 kg)     Health Maintenance Due  Topic Date Due  . TETANUS/TDAP  10/14/2016    There are no preventive care reminders to display for this patient.  Lab Results  Component Value Date   TSH 1.12 09/17/2019   Lab Results  Component Value Date   WBC 4.1 09/17/2019   HGB 14.2 09/17/2019   HCT 42.3 09/17/2019   MCV 89.4 09/17/2019   PLT 233.0 09/17/2019   Lab Results  Component Value Date   NA 139 09/17/2019   K 3.7 09/17/2019   CO2 28 09/17/2019   GLUCOSE 98 09/17/2019   BUN 14 09/17/2019   CREATININE 0.59 09/17/2019   BILITOT 0.5  09/17/2019   ALKPHOS 29 (L) 09/17/2019   AST 23 09/17/2019   ALT 19 09/17/2019   PROT 7.4 09/17/2019   ALBUMIN 4.3 09/17/2019   CALCIUM 9.6 09/17/2019   ANIONGAP 9 10/17/2017   GFR 97.73 09/17/2019   Lab Results  Component Value Date   CHOL 166 09/17/2019   Lab Results  Component Value Date   HDL 67.70 09/17/2019   Lab Results  Component Value Date   LDLCALC 84  09/17/2019   Lab Results  Component Value Date   TRIG 74.0 09/17/2019   Lab Results  Component Value Date   CHOLHDL 2 09/17/2019   Lab Results  Component Value Date   HGBA1C 6.0 02/26/2018      Assessment & Plan:   Problem List Items Addressed This Visit      Other   Dysuria - Primary   Relevant Orders   POCT Urinalysis Dipstick (Completed)   Urine Microscopic Only   Urine Culture      Meds ordered this encounter  Medications  . cefdinir (OMNICEF) 300 MG capsule    Sig: Take 1 capsule (300 mg total) by mouth 2 (two) times daily.    Dispense:  7 capsule    Refill:  0    Order Specific Question:   Supervising Provider    Answer:   Dale Milan [[id number]]    Follow-up: Return if symptoms worsen or fail to improve.  This visit occurred during the SARS-CoV-2 public health emergency.  Safety protocols were in place, including screening questions prior to the visit, additional usage of staff PPE, and extensive cleaning of exam room while observing appropriate contact time as indicated for disinfecting solutions.    Amedeo Kinsman, NP

## 2019-12-08 LAB — URINE CULTURE
MICRO NUMBER:: 10511753
SPECIMEN QUALITY:: ADEQUATE

## 2019-12-10 ENCOUNTER — Telehealth: Payer: Self-pay | Admitting: Family

## 2019-12-10 ENCOUNTER — Telehealth: Payer: Self-pay | Admitting: Nurse Practitioner

## 2019-12-10 MED ORDER — CEFDINIR 300 MG PO CAPS: 300.0000 mg | ORAL_CAPSULE | Freq: Two times a day (BID) | ORAL | 0 refills | Status: AC

## 2019-12-10 NOTE — Telephone Encounter (Signed)
LMTCB

## 2019-12-10 NOTE — Telephone Encounter (Signed)
Pt states that the pharmacy needs clarification on cefdinir (OMNICEF) 300 MG capsule. The directions say twice a day and the qty is only 7 capsules. That would only be about three days f medication. They do not think that is long enough. Please call pharmacy to advise

## 2019-12-10 NOTE — Telephone Encounter (Signed)
Please let the her know  I corrected the Janet Shields prescription amount.

## 2019-12-10 NOTE — Telephone Encounter (Signed)
I spoke to her pharmacist and adjusted her Omnicef Rx to 300 mg BID for 7 days of therapy. #14 pills in total.

## 2019-12-10 NOTE — Telephone Encounter (Signed)
Sending your way since you saw her  Thank you for seeing her!

## 2020-01-20 ENCOUNTER — Telehealth: Payer: Self-pay | Admitting: Family

## 2020-01-20 ENCOUNTER — Ambulatory Visit
Admission: EM | Admit: 2020-01-20 | Discharge: 2020-01-20 | Disposition: A | Discharge status: Home / Self Care | Payer: Medicare Other | Attending: Emergency Medicine | Admitting: Emergency Medicine

## 2020-01-20 ENCOUNTER — Other Ambulatory Visit: Payer: Self-pay

## 2020-01-20 DIAGNOSIS — L03115 Cellulitis of right lower limb: Secondary | ICD-10-CM

## 2020-01-20 DIAGNOSIS — T63481A Toxic effect of venom of other arthropod, accidental (unintentional), initial encounter: Secondary | ICD-10-CM

## 2020-01-20 MED ORDER — CEPHALEXIN 500 MG PO CAPS: 500.0000 mg | ORAL_CAPSULE | Freq: Four times a day (QID) | ORAL | 0 refills | Status: DC

## 2020-01-20 MED ORDER — CEPHALEXIN 500 MG PO CAPS: 500.0000 mg | ORAL_CAPSULE | Freq: Four times a day (QID) | ORAL | 0 refills | Status: AC

## 2020-01-20 MED ORDER — CEFTRIAXONE SODIUM 1 G IJ SOLR
1.0000 g | Freq: Once | INTRAMUSCULAR | Status: AC
  Administered 2020-01-20: 1 g via INTRAMUSCULAR

## 2020-01-20 NOTE — ED Provider Notes (Signed)
Janet Shields    CSN: [id number] Arrival date & time: 01/20/20  0954      History   Chief Complaint Chief Complaint  Patient presents with  . Insect Bite    HPI Janet Shields is a 84 y.o. female.   Patient presents with an wasp sting on her right inner thigh which occurred on 01/17/2020.  She states the area is red, warm, tender.  The redness is spreading.  She denies fever, chills, arthralgias, body aches, numbness, weakness, difficulty swallowing, shortness of breath, abdominal pain, pelvic pain, or other symptoms.  Treatment attempted at home with hydrocortisone cream.  The history is provided by the patient.    Past Medical History:  Diagnosis Date  . History of colon polyps   . Hyperlipidemia   . Osteoporosis     Patient Active Problem List   Diagnosis Date Noted  . Dysuria 12/06/2019  . Routine physical examination 02/25/2018  . Lymphadenopathy 02/25/2018  . Atherosclerosis of aorta (HCC) 10/22/2017  . Herpes zoster without complication 10/22/2017  . Screening for breast cancer 02/14/2016  . Rash and nonspecific skin eruption 02/14/2016  . HLD (hyperlipidemia) 02/24/2014  . Osteoporosis 02/24/2014    Past Surgical History:  Procedure Laterality Date  . BREAST BIOPSY  2007   CORE W/CLIP - NEG    OB History   No obstetric history on file.      Home Medications    Prior to Admission medications   Medication Sig Start Date End Date Taking? Authorizing Provider  Calcium Citrate-Vitamin D (CAL-CITRATE PLUS VITAMIN D PO) Take 1 tablet by mouth daily.    [provider]  cephALEXin (KEFLEX) 500 MG capsule Take 1 capsule (500 mg total) by mouth 4 (four) times daily for 7 days. 01/21/20 01/28/20  Mickie Bail, NP  denosumab (PROLIA) 60 MG/ML SOLN injection Inject 60 mg into the skin every 6 (six) months. Administer in upper arm, thigh, or abdomen    [provider]  Multiple Vitamin (MULTIVITAMIN) tablet Take 1 tablet by mouth daily.     [provider]  simvastatin (ZOCOR) 20 MG tablet TAKE 1 TABLET(20 MG) BY MOUTH AT BEDTIME 11/08/19   Allegra Grana, FNP    Family History Family History  Problem Relation Age of Onset  . Arthritis Mother   . Alcohol abuse Father   . Heart disease Father 28       CAD - died  . Hyperlipidemia Brother   . Diabetes Brother   . Hypertension Brother   . Diabetes Brother   . Hyperlipidemia Brother   . CAD Brother        quadruple bypass  . Diabetes Brother   . Breast cancer Neg Hx     Social History Social History   Tobacco Use  . Smoking status: Never Smoker  . Smokeless tobacco: Never Used  Substance Use Topics  . Alcohol use: Yes    Comment: Occasional wine with dinner  . Drug use: No     Allergies   Tramadol   Review of Systems Review of Systems  Constitutional: Negative for chills and fever.  HENT: Negative for ear pain and sore throat.   Eyes: Negative for pain and visual disturbance.  Respiratory: Negative for cough and shortness of breath.   Cardiovascular: Negative for chest pain and palpitations.  Gastrointestinal: Negative for abdominal pain and vomiting.  Genitourinary: Negative for dysuria and hematuria.  Musculoskeletal: Negative for arthralgias and back pain.  Skin: Positive for  color change and wound.  Neurological: Negative for seizures, syncope, weakness and numbness.  All other systems reviewed and are negative.    Physical Exam Triage Vital Signs ED Triage Vitals  Enc Vitals Group     BP      Pulse      Resp      Temp      Temp src      SpO2      Weight      Height      Head Circumference      Peak Flow      Pain Score      Pain Loc      Pain Edu?      Excl. in GC?    No data found.  Updated Vital Signs BP 130/77 (BP Location: Left Arm)   Pulse 87   Temp 98 F (36.7 C) (Oral)   Resp 17   SpO2 95%   Visual Acuity Right Eye Distance:   Left Eye Distance:   Bilateral Distance:    Right Eye Near:   Left  Eye Near:    Bilateral Near:     Physical Exam Vitals and nursing note reviewed.  Constitutional:      General: She is not in acute distress.    Appearance: She is well-developed. She is not ill-appearing.  HENT:     Head: Normocephalic and atraumatic.     Mouth/Throat:     Mouth: Mucous membranes are moist.     Pharynx: Oropharynx is clear.  Eyes:     Conjunctiva/sclera: Conjunctivae normal.  Cardiovascular:     Rate and Rhythm: Normal rate and regular rhythm.     Heart sounds: No murmur heard.   Pulmonary:     Effort: Pulmonary effort is normal. No respiratory distress.     Breath sounds: Normal breath sounds.  Abdominal:     Palpations: Abdomen is soft.     Tenderness: There is no abdominal tenderness. There is no guarding or rebound.  Musculoskeletal:        General: Normal range of motion.     Cervical back: Neck supple.  Skin:    General: Skin is warm and dry.     Findings: Erythema and lesion present.     Comments: 22 x 15 cm area of erythema on right inner thigh with central dark erythema and 2 cm area of induration.  See pictures for details.   Neurological:     General: No focal deficit present.     Mental Status: She is alert and oriented to person, place, and time.     Gait: Gait normal.  Psychiatric:        Mood and Affect: Mood normal.        Behavior: Behavior normal.            UC Treatments / Results  Labs (all labs ordered are listed, but only abnormal results are displayed) Labs Reviewed - No data to display  EKG   Radiology No results found.  Procedures Procedures (including critical care time)  Medications Ordered in UC Medications  cefTRIAXone (ROCEPHIN) injection 1 g (has no administration in time range)    Initial Impression / Assessment and Plan / UC Course  I have reviewed the triage vital signs and the nursing notes.  Pertinent labs & imaging results that were available during my care of the patient were reviewed by me  and considered in my medical decision making (see chart for  details).   Cellulitis of right thigh due to an insect sting.  Rocephin 1 g given here.  Start Keflex tomorrow.  Instructed patient to go to the emergency department if the redness extends outside the drawn pen markings or if she develops new symptoms such as fever or chills.  Otherwise instructed her to follow-up with her PCP on Monday.  Patient agrees to plan of care.   Final Clinical Impressions(s) / UC Diagnoses   Final diagnoses:  Insect stings, accidental or unintentional, initial encounter  Cellulitis of right thigh     Discharge Instructions     You were given an injection of an antibiotic called Rocephin.  Start taking the Keflex.    Go to the emergency department if the redness on your leg extends outside the pen markings or if you develop new symptoms such as fever or chills.    Schedule a follow-up visit with your primary care provider for early next week, preferably Monday.        ED Prescriptions    Medication Sig Dispense Auth. Provider   cephALEXin (KEFLEX) 500 MG capsule  (Status: Discontinued) Take 1 capsule (500 mg total) by mouth 4 (four) times daily for 7 days. 28 capsule Wendee Beavers H, NP   cephALEXin (KEFLEX) 500 MG capsule Take 1 capsule (500 mg total) by mouth 4 (four) times daily for 7 days. 28 capsule Mickie Bail, NP     PDMP not reviewed this encounter.   Mickie Bail, NP 01/20/20 1036

## 2020-01-20 NOTE — Discharge Instructions (Addendum)
You were given an injection of an antibiotic called Rocephin.  Start taking the Keflex.    Go to the emergency department if the redness on your leg extends outside the pen markings or if you develop new symptoms such as fever or chills.    Schedule a follow-up visit with your primary care provider for early next week, preferably Monday.

## 2020-01-20 NOTE — ED Triage Notes (Signed)
Pt presents with wasp sting on inside right groin area from Monday; pt states the area is tender to touch and more swollen.  Pt states she believes it is developing into cellulitis .

## 2020-01-20 NOTE — Telephone Encounter (Signed)
Pt has a bite on inner thigh that is now becoming red and swollen  Scheduled pt for 7/9 with [facility]

## 2020-01-20 NOTE — Telephone Encounter (Signed)
Patient stated she was watering her shrubs and felt a sharp burning in her grown/inner thigh. She felt like she was stung by a wasp. She looked around for what could've caused it. Patient has been using cortisone cream and antimicrobial gel for sx. Since yesterday the spot has grown to the size of her hand. There is a mass in the center. No fever, chills, nausea, and vomiting. Due to her sx I suggested she go to urgent care, we have no availability. Patient stated she will go to UC.

## 2020-01-21 ENCOUNTER — Ambulatory Visit: Payer: Medicare Other | Admitting: Nurse Practitioner

## 2020-01-25 ENCOUNTER — Other Ambulatory Visit: Payer: Self-pay

## 2020-01-25 ENCOUNTER — Ambulatory Visit (INDEPENDENT_AMBULATORY_CARE_PROVIDER_SITE_OTHER): Payer: Medicare Other | Admitting: Family

## 2020-01-25 DIAGNOSIS — L03115 Cellulitis of right lower limb: Secondary | ICD-10-CM | POA: Diagnosis not present

## 2020-01-25 DIAGNOSIS — L0390 Cellulitis, unspecified: Secondary | ICD-10-CM | POA: Insufficient documentation

## 2020-01-25 NOTE — Progress Notes (Signed)
Subjective:    Patient ID: Janet Shields, female    DOB: 12/19/1935, 84 y.o.   MRN: [id number]  CC: Janet Shields is a 84 y.o. female who presents today for urgent care follow up.   HPI: Follow up wasp sting 8 days ago to right thigh, improving to resolved. She almost canceled the appointment due to resolution of symptoms.  Suspect wasps or yellow jacket.  Still taking keflex, 2 more days.  NO redness, swelling. Scab present. No fever, HA, myalgia, rashes, difficultly breathing, trouble swallowing. No h/o anaphylaxis.  Has identified wasp nest in ground and daughter , son have sprayed nest twice.   No tick bites.     Urgent care 01/20/20 cellulitis right thigh, given keflex TID x 7 days. rocephen 1g given .  HISTORY:  Past Medical History:  Diagnosis Date  . History of colon polyps   . Hyperlipidemia   . Osteoporosis    Past Surgical History:  Procedure Laterality Date  . BREAST BIOPSY  2007   CORE W/CLIP - NEG   Family History  Problem Relation Age of Onset  . Arthritis Mother   . Alcohol abuse Father   . Heart disease Father 57       CAD - died  . Hyperlipidemia Brother   . Diabetes Brother   . Hypertension Brother   . Diabetes Brother   . Hyperlipidemia Brother   . CAD Brother        quadruple bypass  . Diabetes Brother   . Breast cancer Neg Hx     Allergies: Tramadol Current Outpatient Medications on File Prior to Visit  Medication Sig Dispense Refill  . Calcium Citrate-Vitamin D (CAL-CITRATE PLUS VITAMIN D PO) Take 1 tablet by mouth daily.    . cephALEXin (KEFLEX) 500 MG capsule Take 1 capsule (500 mg total) by mouth 4 (four) times daily for 7 days. 28 capsule 0  . denosumab (PROLIA) 60 MG/ML SOLN injection Inject 60 mg into the skin every 6 (six) months. Administer in upper arm, thigh, or abdomen    . Multiple Vitamin (MULTIVITAMIN) tablet Take 1 tablet by mouth daily.    . simvastatin (ZOCOR) 20 MG tablet TAKE 1 TABLET(20 MG) BY MOUTH AT BEDTIME 30  tablet 5   No current facility-administered medications on file prior to visit.    Social History   Tobacco Use  . Smoking status: Never Smoker  . Smokeless tobacco: Never Used  Substance Use Topics  . Alcohol use: Yes    Comment: Occasional wine with dinner  . Drug use: No    Review of Systems  Constitutional: Negative for chills and fever.  HENT: Negative for trouble swallowing.   Respiratory: Negative for cough and shortness of breath.   Cardiovascular: Negative for chest pain and palpitations.  Gastrointestinal: Negative for nausea and vomiting.  Skin: Positive for wound (healing). Negative for rash.      Objective:    BP 126/64   Pulse 90   Temp 98 F (36.7 C)   Resp 16   Ht 4\' 11"  (1.499 m)   Wt 116 lb (52.6 kg)   SpO2 98%   BMI 23.43 kg/m  BP Readings from Last 3 Encounters:  01/25/20 126/64  01/20/20 130/77  12/06/19 130/72   Wt Readings from Last 3 Encounters:  01/25/20 116 lb (52.6 kg)  12/06/19 116 lb 3.2 oz (52.7 kg)  10/22/19 116 lb (52.6 kg)    Physical Exam Vitals reviewed.  Constitutional:  Appearance: She is well-developed.  Eyes:     Conjunctiva/sclera: Conjunctivae normal.  Cardiovascular:     Rate and Rhythm: Normal rate and regular rhythm.     Pulses: Normal pulses.     Heart sounds: Normal heart sounds.  Pulmonary:     Effort: Pulmonary effort is normal.     Breath sounds: Normal breath sounds. No wheezing, rhonchi or rales.  Skin:    General: Skin is warm and dry.     Comments: Scab present medial right upper thigh. No erythema, edema, increased warmth. Skin intact  Neurological:     Mental Status: She is alert.  Psychiatric:        Speech: Speech normal.        Behavior: Behavior normal.        Thought Content: Thought content normal.        Assessment & Plan:   Problem List Items Addressed This Visit      Other   Cellulitis    Resolved. Patient declines further evaluation after reaction, infection with  allergist. No h/o anaphylaxis nor difficulty breathing after this insect bite. She doesn't want to carry nor feels necessary to have an EpiPen which I offered today.           I am having Janet Shields maintain her denosumab, multivitamin, Calcium Citrate-Vitamin D (CAL-CITRATE PLUS VITAMIN D PO), simvastatin, and cephALEXin.   No orders of the defined types were placed in this encounter.   Return precautions given.   Risks, benefits, and alternatives of the medications and treatment plan prescribed today were discussed, and patient expressed understanding.   Education regarding symptom management and diagnosis given to patient on AVS.  Continue to follow with Allegra Grana, FNP for routine health maintenance.   Janet Shields and I agreed with plan.   [name], FNP

## 2020-01-25 NOTE — Assessment & Plan Note (Addendum)
Resolved. Patient declines further evaluation after reaction, infection with allergist. No h/o anaphylaxis nor difficulty breathing after this insect bite. She doesn't want to carry nor feels necessary to have an EpiPen which I offered today.

## 2020-01-25 NOTE — Patient Instructions (Signed)
Nice to see you, always!

## 2020-01-25 NOTE — Telephone Encounter (Signed)
Seen in urgent care 01/20/20

## 2020-05-01 ENCOUNTER — Other Ambulatory Visit: Payer: Self-pay | Admitting: Family

## 2020-05-01 ENCOUNTER — Ambulatory Visit: Payer: Medicare Other | Attending: Internal Medicine

## 2020-05-01 DIAGNOSIS — Z1231 Encounter for screening mammogram for malignant neoplasm of breast: Secondary | ICD-10-CM

## 2020-05-01 DIAGNOSIS — Z23 Encounter for immunization: Secondary | ICD-10-CM

## 2020-05-01 NOTE — Progress Notes (Signed)
   Covid-19 Vaccination Clinic  Name:  Chloie Loney    MRN: [id number] DOB: 1935/10/23  05/01/2020  Ms. Mccoll was observed post Covid-19 immunization for 15 minutes without incident. She was provided with Vaccine Information Sheet and instruction to access the V-Safe system.   Ms. Gorman was instructed to call 911 with any severe reactions post vaccine: Marland Kitchen Difficulty breathing  . Swelling of face and throat  . A fast heartbeat  . A bad rash all over body  . Dizziness and weakness

## 2020-05-15 ENCOUNTER — Ambulatory Visit
Admission: EM | Admit: 2020-05-15 | Discharge: 2020-05-15 | Disposition: A | Discharge status: Home / Self Care | Payer: Medicare Other | Attending: Emergency Medicine | Admitting: Emergency Medicine

## 2020-05-15 DIAGNOSIS — N390 Urinary tract infection, site not specified: Secondary | ICD-10-CM

## 2020-05-15 LAB — POCT URINALYSIS DIP (MANUAL ENTRY)
Glucose, UA: 100 mg/dL — AB
Nitrite, UA: POSITIVE — AB
Protein Ur, POC: 100 mg/dL — AB
Spec Grav, UA: 1.01 (ref 1.010–1.025)
Urobilinogen, UA: 1 E.U./dL
pH, UA: 5 (ref 5.0–8.0)

## 2020-05-15 MED ORDER — CEPHALEXIN 500 MG PO CAPS: 500.0000 mg | ORAL_CAPSULE | Freq: Two times a day (BID) | ORAL | 0 refills | Status: AC

## 2020-05-15 NOTE — ED Triage Notes (Signed)
Pt presents with concern for UTI. Pt has been experiencing urinary frequency, urgency, and dysuria since Friday. Denies any abdominal pain.

## 2020-05-15 NOTE — ED Provider Notes (Signed)
Janet Shields    CSN: [id number] Arrival date & time: 05/15/20  6734      History   Chief Complaint Chief Complaint  Patient presents with  . Urinary Frequency    HPI Janet Shields is a 84 y.o. female.   Patient presents with 4-day history of dysuria, frequency, urgency.  She denies fever, chills, abdominal pain, back pain, pelvic pain, or other symptoms.  Treatment attempted at home with Azo.  Her medical history includes osteoporosis, colon polyps, hyperlipidemia, atherosclerosis, herpes zoster.  The history is provided by the patient and medical records.    Past Medical History:  Diagnosis Date  . History of colon polyps   . Hyperlipidemia   . Osteoporosis     Patient Active Problem List   Diagnosis Date Noted  . Cellulitis 01/25/2020  . Dysuria 12/06/2019  . Routine physical examination 02/25/2018  . Lymphadenopathy 02/25/2018  . Atherosclerosis of aorta (HCC) 10/22/2017  . Herpes zoster without complication 10/22/2017  . Screening for breast cancer 02/14/2016  . Rash and nonspecific skin eruption 02/14/2016  . HLD (hyperlipidemia) 02/24/2014  . Osteoporosis 02/24/2014    Past Surgical History:  Procedure Laterality Date  . BREAST BIOPSY  2007   CORE W/CLIP - NEG    OB History   No obstetric history on file.      Home Medications    Prior to Admission medications   Medication Sig Start Date End Date Taking? Authorizing Provider  Calcium Citrate-Vitamin D (CAL-CITRATE PLUS VITAMIN D PO) Take 1 tablet by mouth daily.    [provider]  cephALEXin (KEFLEX) 500 MG capsule Take 1 capsule (500 mg total) by mouth 2 (two) times daily for 5 days. 05/15/20 05/20/20  Mickie Bail, NP  denosumab (PROLIA) 60 MG/ML SOLN injection Inject 60 mg into the skin every 6 (six) months. Administer in upper arm, thigh, or abdomen    [provider]  Multiple Vitamin (MULTIVITAMIN) tablet Take 1 tablet by mouth daily.    [provider]    simvastatin (ZOCOR) 20 MG tablet TAKE 1 TABLET(20 MG) BY MOUTH AT BEDTIME 11/08/19   Allegra Grana, FNP    Family History Family History  Problem Relation Age of Onset  . Arthritis Mother   . Alcohol abuse Father   . Heart disease Father 83       CAD - died  . Hyperlipidemia Brother   . Diabetes Brother   . Hypertension Brother   . Diabetes Brother   . Hyperlipidemia Brother   . CAD Brother        quadruple bypass  . Diabetes Brother   . Breast cancer Neg Hx     Social History Social History   Tobacco Use  . Smoking status: Never Smoker  . Smokeless tobacco: Never Used  Substance Use Topics  . Alcohol use: Yes    Comment: Occasional wine with dinner  . Drug use: No     Allergies   Tramadol   Review of Systems Review of Systems  Constitutional: Negative for chills and fever.  HENT: Negative for ear pain and sore throat.   Eyes: Negative for pain and visual disturbance.  Respiratory: Negative for cough and shortness of breath.   Cardiovascular: Negative for chest pain and palpitations.  Gastrointestinal: Negative for abdominal pain and vomiting.  Genitourinary: Positive for dysuria, frequency and urgency. Negative for hematuria.  Musculoskeletal: Negative for arthralgias and back pain.  Skin: Negative for color change and rash.  Neurological: Negative for seizures and syncope.  All other systems reviewed and are negative.    Physical Exam Triage Vital Signs ED Triage Vitals [05/15/20 0824]  Enc Vitals Group     BP      Pulse      Resp      Temp      Temp src      SpO2      Weight      Height      Head Circumference      Peak Flow      Pain Score 0     Pain Loc      Pain Edu?      Excl. in GC?    No data found.  Updated Vital Signs BP (!) 145/79   Pulse 81   Temp 98.2 F (36.8 C)   Resp 19   SpO2 95%   Visual Acuity Right Eye Distance:   Left Eye Distance:   Bilateral Distance:    Right Eye Near:   Left Eye Near:     Bilateral Near:     Physical Exam Vitals and nursing note reviewed.  Constitutional:      General: She is not in acute distress.    Appearance: She is well-developed. She is not ill-appearing.  HENT:     Head: Normocephalic and atraumatic.     Mouth/Throat:     Mouth: Mucous membranes are moist.  Eyes:     Conjunctiva/sclera: Conjunctivae normal.  Cardiovascular:     Rate and Rhythm: Normal rate and regular rhythm.     Heart sounds: Normal heart sounds.  Pulmonary:     Effort: Pulmonary effort is normal. No respiratory distress.     Breath sounds: Normal breath sounds.  Abdominal:     Palpations: Abdomen is soft.     Tenderness: There is no abdominal tenderness. There is no right CVA tenderness, left CVA tenderness, guarding or rebound.  Musculoskeletal:     Cervical back: Neck supple.  Skin:    General: Skin is warm and dry.     Findings: No rash.  Neurological:     General: No focal deficit present.     Mental Status: She is alert and oriented to person, place, and time.     Gait: Gait normal.  Psychiatric:        Mood and Affect: Mood normal.        Behavior: Behavior normal.      UC Treatments / Results  Labs (all labs ordered are listed, but only abnormal results are displayed) Labs Reviewed  POCT URINALYSIS DIP (MANUAL ENTRY) - Abnormal; Notable for the following components:      Result Value   Color, UA orange (*)    Clarity, UA cloudy (*)    Glucose, UA =100 (*)    Bilirubin, UA small (*)    Ketones, POC UA trace (5) (*)    Blood, UA moderate (*)    Protein Ur, POC =100 (*)    Nitrite, UA Positive (*)    Leukocytes, UA Large (3+) (*)    All other components within normal limits  URINE CULTURE    EKG   Radiology No results found.  Procedures Procedures (including critical care time)  Medications Ordered in UC Medications - No data to display  Initial Impression / Assessment and Plan / UC Course  I have reviewed the triage vital signs and  the nursing notes.  Pertinent labs & imaging results that  were available during my care of the patient were reviewed by me and considered in my medical decision making (see chart for details).   UTI.  Urine culture pending.  Treating with Keflex.  Discussed with patient that we will call her if the culture shows the need to change or discontinue her antibiotic.  Instructed patient to follow-up with her PCP if her symptoms are not improving.  Patient agrees to plan of care.   Final Clinical Impressions(s) / UC Diagnoses   Final diagnoses:  Urinary tract infection without hematuria, site unspecified     Discharge Instructions     Take the antibiotic as directed.  Urine culture is pending.  We will call you if it shows the need to change or discontinue your antibiotic.  Follow up with your primary care provider if your symptoms are not improving.       ED Prescriptions    Medication Sig Dispense Auth. Provider   cephALEXin (KEFLEX) 500 MG capsule Take 1 capsule (500 mg total) by mouth 2 (two) times daily for 5 days. 10 capsule Mickie Bail, NP     PDMP not reviewed this encounter.   Mickie Bail, NP 05/15/20 [phone]

## 2020-05-15 NOTE — Discharge Instructions (Addendum)
Take the antibiotic as directed.  Urine culture is pending.  We will call you if it shows the need to change or discontinue your antibiotic.  Follow up with your primary care provider if your symptoms are not improving.

## 2020-05-16 LAB — URINE CULTURE

## 2020-05-17 LAB — URINE CULTURE: Culture: >=100000 — AB

## 2020-06-12 ENCOUNTER — Ambulatory Visit
Admission: RE | Admit: 2020-06-12 | Discharge: 2020-06-12 | Disposition: A | Discharge status: Home / Self Care | Payer: Medicare Other | Source: Ambulatory Visit | Attending: Family | Admitting: Family

## 2020-06-12 ENCOUNTER — Other Ambulatory Visit: Payer: Self-pay

## 2020-06-12 DIAGNOSIS — Z1231 Encounter for screening mammogram for malignant neoplasm of breast: Secondary | ICD-10-CM | POA: Diagnosis not present

## 2020-07-20 ENCOUNTER — Telehealth: Payer: Self-pay | Admitting: Family

## 2020-07-20 DIAGNOSIS — E785 Hyperlipidemia, unspecified: Secondary | ICD-10-CM

## 2020-07-24 MED ORDER — SIMVASTATIN 20 MG PO TABS: 20.0000 mg | ORAL_TABLET | Freq: Every day | ORAL | 5 refills | Status: DC

## 2020-07-24 NOTE — Addendum Note (Signed)
Addended by: Elise Benne T on: 07/24/2020 09:02 AM   Modules accepted: Orders

## 2020-07-24 NOTE — Telephone Encounter (Signed)
Pt called to follow up on med refill 

## 2020-09-29 ENCOUNTER — Other Ambulatory Visit: Payer: Self-pay

## 2020-09-29 ENCOUNTER — Ambulatory Visit
Admission: EM | Admit: 2020-09-29 | Discharge: 2020-09-29 | Disposition: A | Discharge status: Home / Self Care | Payer: Medicare Other | Attending: Family Medicine | Admitting: Family Medicine

## 2020-09-29 DIAGNOSIS — N3001 Acute cystitis with hematuria: Secondary | ICD-10-CM | POA: Diagnosis not present

## 2020-09-29 LAB — POCT URINALYSIS DIP (MANUAL ENTRY)
Glucose, UA: 250 mg/dL — AB
Nitrite, UA: POSITIVE — AB
Protein Ur, POC: >=300 mg/dL — AB
Spec Grav, UA: 1.01 (ref 1.010–1.025)
Urobilinogen, UA: 4 E.U./dL — AB
pH, UA: 5 — AB (ref 5.0–8.0)

## 2020-09-29 MED ORDER — CEPHALEXIN 500 MG PO CAPS: 500.0000 mg | ORAL_CAPSULE | Freq: Two times a day (BID) | ORAL | 0 refills | Status: AC

## 2020-09-29 NOTE — ED Provider Notes (Signed)
Janet Shields    CSN: [id number] Arrival date & time: 09/29/20  1129      History   Chief Complaint Chief Complaint  Patient presents with  . Urinary Tract Infection    HPI Janet Shields is a 85 y.o. female.   Patient is an 85 year old female presents today with complaints of urinary frequency, dysuria that started yesterday.  She has been treating with over-the-counter AZO.  Denies any associated fever, flank pain, nausea, vomiting.    Urinary Tract Infection   Past Medical History:  Diagnosis Date  . History of colon polyps   . Hyperlipidemia   . Osteoporosis     Patient Active Problem List   Diagnosis Date Noted  . Cellulitis 01/25/2020  . Dysuria 12/06/2019  . Routine physical examination 02/25/2018  . Lymphadenopathy 02/25/2018  . Atherosclerosis of aorta (HCC) 10/22/2017  . Herpes zoster without complication 10/22/2017  . Screening for breast cancer 02/14/2016  . Rash and nonspecific skin eruption 02/14/2016  . HLD (hyperlipidemia) 02/24/2014  . Osteoporosis 02/24/2014    Past Surgical History:  Procedure Laterality Date  . BREAST BIOPSY  2007   CORE W/CLIP - NEG    OB History   No obstetric history on file.      Home Medications    Prior to Admission medications   Medication Sig Start Date End Date Taking? Authorizing Provider  cephALEXin (KEFLEX) 500 MG capsule Take 1 capsule (500 mg total) by mouth 2 (two) times daily for 7 days. 09/29/20 10/06/20 Yes Tamre Cass A, NP  Calcium Citrate-Vitamin D (CAL-CITRATE PLUS VITAMIN D PO) Take 1 tablet by mouth daily.    [provider]  denosumab (PROLIA) 60 MG/ML SOLN injection Inject 60 mg into the skin every 6 (six) months. Administer in upper arm, thigh, or abdomen    [provider]  Multiple Vitamin (MULTIVITAMIN) tablet Take 1 tablet by mouth daily.    [provider]  simvastatin (ZOCOR) 20 MG tablet Take 1 tablet (20 mg total) by mouth daily at 6 PM. 07/24/20    Arnett, Lyn Records, FNP    Family History Family History  Problem Relation Age of Onset  . Arthritis Mother   . Alcohol abuse Father   . Heart disease Father 87       CAD - died  . Hyperlipidemia Brother   . Diabetes Brother   . Hypertension Brother   . Diabetes Brother   . Hyperlipidemia Brother   . CAD Brother        quadruple bypass  . Diabetes Brother   . Breast cancer Neg Hx     Social History Social History   Tobacco Use  . Smoking status: Never Smoker  . Smokeless tobacco: Never Used  Substance Use Topics  . Alcohol use: Yes    Comment: Occasional wine with dinner  . Drug use: No     Allergies   Tramadol   Review of Systems Review of Systems   Physical Exam Triage Vital Signs ED Triage Vitals  Enc Vitals Group     BP 09/29/20 1143 131/76     Pulse Rate 09/29/20 1143 87     Resp 09/29/20 1143 18     Temp 09/29/20 1143 97.6 F (36.4 C)     Temp Source 09/29/20 1143 Oral     SpO2 09/29/20 1143 95 %     Weight 09/29/20 1148 115 lb (52.2 kg)     Height --  Head Circumference --      Peak Flow --      Pain Score 09/29/20 1145 0     Pain Loc --      Pain Edu? --      Excl. in GC? --    No data found.  Updated Vital Signs BP 131/76 (BP Location: Left Arm)   Pulse 87   Temp 97.6 F (36.4 C) (Oral)   Resp 18   Wt 115 lb (52.2 kg)   SpO2 95%   BMI 23.23 kg/m   Visual Acuity Right Eye Distance:   Left Eye Distance:   Bilateral Distance:    Right Eye Near:   Left Eye Near:    Bilateral Near:     Physical Exam Vitals and nursing note reviewed.  Constitutional:      General: She is not in acute distress.    Appearance: Normal appearance. She is not ill-appearing, toxic-appearing or diaphoretic.  HENT:     Head: Normocephalic.  Eyes:     Conjunctiva/sclera: Conjunctivae normal.  Pulmonary:     Effort: Pulmonary effort is normal.  Musculoskeletal:        General: Normal range of motion.     Cervical back: Normal range of  motion.  Skin:    General: Skin is warm and dry.     Findings: No rash.  Neurological:     Mental Status: She is alert.  Psychiatric:        Mood and Affect: Mood normal.      UC Treatments / Results  Labs (all labs ordered are listed, but only abnormal results are displayed) Labs Reviewed  POCT URINALYSIS DIP (MANUAL ENTRY) - Abnormal; Notable for the following components:      Result Value   Color, UA orange (*)    Clarity, UA cloudy (*)    Glucose, UA =250 (*)    Bilirubin, UA small (*)    Ketones, POC UA small (15) (*)    Blood, UA large (*)    pH, UA 5.0 (*)    Protein Ur, POC >=300 (*)    Urobilinogen, UA 4.0 (*)    Nitrite, UA Positive (*)    Leukocytes, UA Large (3+) (*)    All other components within normal limits  URINE CULTURE    EKG   Radiology No results found.  Procedures Procedures (including critical care time)  Medications Ordered in UC Medications - No data to display  Initial Impression / Assessment and Plan / UC Course  I have reviewed the triage vital signs and the nursing notes.  Pertinent labs & imaging results that were available during my care of the patient were reviewed by me and considered in my medical decision making (see chart for details).     Acute cystitis with hematuria. Urinalysis with concerns for UTI today.  Will send for culture.  Will go ahead and treat today with antibiotics Recommended increase fluids. Follow up as needed for continued or worsening symptoms  Final Clinical Impressions(s) / UC Diagnoses   Final diagnoses:  Acute cystitis with hematuria     Discharge Instructions     Treating you for a urinary tract infection Take the antibiotics as prescribed Increase fluids.  Follow up as needed for continued or worsening symptoms     ED Prescriptions    Medication Sig Dispense Auth. Provider   cephALEXin (KEFLEX) 500 MG capsule Take 1 capsule (500 mg total) by mouth 2 (two) times daily for 7  days.  14 capsule Legend Pecore A, NP     PDMP not reviewed this encounter.   Janace Aris, NP 09/29/20 1217

## 2020-09-29 NOTE — Discharge Instructions (Addendum)
Treating you for a urinary tract infection °Take the antibiotics as prescribed °Increase fluids  °Follow up as needed for continued or worsening symptoms ° °

## 2020-09-29 NOTE — ED Triage Notes (Signed)
Patient presents to Urgent Care with complaints of increased frequency, dysuria since yesterday. Treating with OTC azo.   Denies fever, abdominal pain.

## 2020-10-01 LAB — URINE CULTURE: Culture: <10000 — AB

## 2020-10-24 ENCOUNTER — Ambulatory Visit: Payer: Medicare Other

## 2020-10-26 ENCOUNTER — Ambulatory Visit (INDEPENDENT_AMBULATORY_CARE_PROVIDER_SITE_OTHER): Payer: Medicare Other

## 2020-10-26 VITALS — Ht 59.0 in | Wt 115.0 lb

## 2020-10-26 DIAGNOSIS — Z0000 Encounter for general adult medical examination without abnormal findings: Secondary | ICD-10-CM

## 2020-10-26 NOTE — Patient Instructions (Addendum)
[name] , Thank you for taking time to come for your Medicare Wellness Visit. I appreciate your ongoing commitment to your health goals. Please review the following plan we discussed and let me know if I can assist you in the future.   These are the goals we discussed: Goals      Patient Stated   .  Maintain Healthy Lifestyle (pt-stated)      Stay active (12,000 steps daily) Healthy diet       This is a list of the screening recommended for you and due dates:  Health Maintenance  Topic Date Due  . Tetanus Vaccine  10/14/2016  . Flu Shot  02/12/2021  . DEXA scan (bone density measurement)  Completed  . COVID-19 Vaccine  Completed  . Pneumonia vaccines  Completed  . HPV Vaccine  Aged Out    Immunizations Immunization History  Administered Date(s) Administered  . DTaP 10/17/2008  . Fluad Quad(high Dose 65+) 03/30/2019  . Influenza, High Dose Seasonal PF 04/28/2017  . Influenza-Unspecified 04/18/2016, 04/15/2017, 04/14/2020  . PFIZER(Purple Top)SARS-COV-2 Vaccination 08/10/2019, 08/31/2019, 05/01/2020  . Pneumococcal Conjugate-13 03/24/2014  . Pneumococcal Polysaccharide-23 12/23/2006  . Zoster 12/23/2006   Advanced directives: End of life planning; Advance aging; Advanced directives discussed.  Copy of current HCPOA/Living Will requested.    Conditions/risks identified: none new  Follow up in one year for your annual wellness visit.   Preventive Care 8 Years and Older, Female Preventive care refers to lifestyle choices and visits with your health care provider that can promote health and wellness. What does preventive care include?  A yearly physical exam. This is also called an annual well check.  Dental exams once or twice a year.  Routine eye exams. Ask your health care provider how often you should have your eyes checked.  Personal lifestyle choices, including:  Daily care of your teeth and gums.  Regular physical activity.  Eating a healthy  diet.  Avoiding tobacco and drug use.  Limiting alcohol use.  Practicing safe sex.  Taking low-dose aspirin every day.  Taking vitamin and mineral supplements as recommended by your health care provider. What happens during an annual well check? The services and screenings done by your health care provider during your annual well check will depend on your age, overall health, lifestyle risk factors, and family history of disease. Counseling  Your health care provider may ask you questions about your:  Alcohol use.  Tobacco use.  Drug use.  Emotional well-being.  Home and relationship well-being.  Sexual activity.  Eating habits.  History of falls.  Memory and ability to understand (cognition).  Work and work Astronomer.  Reproductive health. Screening  You may have the following tests or measurements:  Height, weight, and BMI.  Blood pressure.  Lipid and cholesterol levels. These may be checked every 5 years, or more frequently if you are over 9 years old.  Skin check.  Lung cancer screening. You may have this screening every year starting at age 38 if you have a 30-pack-year history of smoking and currently smoke or have quit within the past 15 years.  Fecal occult blood test (FOBT) of the stool. You may have this test every year starting at age 81.  Flexible sigmoidoscopy or colonoscopy. You may have a sigmoidoscopy every 5 years or a colonoscopy every 10 years starting at age 34.  Hepatitis C blood test.  Hepatitis B blood test.  Sexually transmitted disease (STD) testing.  Diabetes screening. This is done  by checking your blood sugar (glucose) after you have not eaten for a while (fasting). You may have this done every 1-3 years.  Bone density scan. This is done to screen for osteoporosis. You may have this done starting at age 62.  Mammogram. This may be done every 1-2 years. Talk to your health care provider about how often you should have  regular mammograms. Talk with your health care provider about your test results, treatment options, and if necessary, the need for more tests. Vaccines  Your health care provider may recommend certain vaccines, such as:  Influenza vaccine. This is recommended every year.  Tetanus, diphtheria, and acellular pertussis (Tdap, Td) vaccine. You may need a Td booster every 10 years.  Zoster vaccine. You may need this after age 60.  Pneumococcal 13-valent conjugate (PCV13) vaccine. One dose is recommended after age 45.  Pneumococcal polysaccharide (PPSV23) vaccine. One dose is recommended after age 51. Talk to your health care provider about which screenings and vaccines you need and how often you need them. This information is not intended to replace advice given to you by your health care provider. Make sure you discuss any questions you have with your health care provider. Document Released: 07/28/2015 Document Revised: 03/20/2016 Document Reviewed: 05/02/2015 Elsevier Interactive Patient Education  2017 [employer].  Fall Prevention in the Home Falls can cause injuries. They can happen to people of all ages. There are many things you can do to make your home safe and to help prevent falls. What can I do on the outside of my home?  Regularly fix the edges of walkways and driveways and fix any cracks.  Remove anything that might make you trip as you walk through a door, such as a raised step or threshold.  Trim any bushes or trees on the path to your home.  Use bright outdoor lighting.  Clear any walking paths of anything that might make someone trip, such as rocks or tools.  Regularly check to see if handrails are loose or broken. Make sure that both sides of any steps have handrails.  Any raised decks and porches should have guardrails on the edges.  Have any leaves, snow, or ice cleared regularly.  Use sand or salt on walking paths during winter.  Clean up any spills in your  garage right away. This includes oil or grease spills. What can I do in the bathroom?  Use night lights.  Install grab bars by the toilet and in the tub and shower. Do not use towel bars as grab bars.  Use non-skid mats or decals in the tub or shower.  If you need to sit down in the shower, use a plastic, non-slip stool.  Keep the floor dry. Clean up any water that spills on the floor as soon as it happens.  Remove soap buildup in the tub or shower regularly.  Attach bath mats securely with double-sided non-slip rug tape.  Do not have throw rugs and other things on the floor that can make you trip. What can I do in the bedroom?  Use night lights.  Make sure that you have a light by your bed that is easy to reach.  Do not use any sheets or blankets that are too big for your bed. They should not hang down onto the floor.  Have a firm chair that has side arms. You can use this for support while you get dressed.  Do not have throw rugs and other things on  the floor that can make you trip. What can I do in the kitchen?  Clean up any spills right away.  Avoid walking on wet floors.  Keep items that you use a lot in easy-to-reach places.  If you need to reach something above you, use a strong step stool that has a grab bar.  Keep electrical cords out of the way.  Do not use floor polish or wax that makes floors slippery. If you must use wax, use non-skid floor wax.  Do not have throw rugs and other things on the floor that can make you trip. What can I do with my stairs?  Do not leave any items on the stairs.  Make sure that there are handrails on both sides of the stairs and use them. Fix handrails that are broken or loose. Make sure that handrails are as long as the stairways.  Check any carpeting to make sure that it is firmly attached to the stairs. Fix any carpet that is loose or worn.  Avoid having throw rugs at the top or bottom of the stairs. If you do have throw  rugs, attach them to the floor with carpet tape.  Make sure that you have a light switch at the top of the stairs and the bottom of the stairs. If you do not have them, ask someone to add them for you. What else can I do to help prevent falls?  Wear shoes that:  Do not have high heels.  Have rubber bottoms.  Are comfortable and fit you well.  Are closed at the toe. Do not wear sandals.  If you use a stepladder:  Make sure that it is fully opened. Do not climb a closed stepladder.  Make sure that both sides of the stepladder are locked into place.  Ask someone to hold it for you, if possible.  Clearly mark and make sure that you can see:  Any grab bars or handrails.  First and last steps.  Where the edge of each step is.  Use tools that help you move around (mobility aids) if they are needed. These include:  Canes.  Walkers.  Scooters.  Crutches.  Turn on the lights when you go into a dark area. Replace any light bulbs as soon as they burn out.  Set up your furniture so you have a clear path. Avoid moving your furniture around.  If any of your floors are uneven, fix them.  If there are any pets around you, be aware of where they are.  Review your medicines with your doctor. Some medicines can make you feel dizzy. This can increase your chance of falling. Ask your doctor what other things that you can do to help prevent falls. This information is not intended to replace advice given to you by your health care provider. Make sure you discuss any questions you have with your health care provider. Document Released: 04/27/2009 Document Revised: 12/07/2015 Document Reviewed: 08/05/2014 Elsevier Interactive Patient Education  2017 [employer].

## 2020-10-26 NOTE — Progress Notes (Addendum)
Subjective:   Janet Shields is a 85 y.o. female who presents for Medicare Annual (Subsequent) preventive examination.  Review of Systems    No ROS.  Medicare Wellness Virtual Visit.   Cardiac Risk Factors include: advanced age (>68men, >29 women)     Objective:    Today's Vitals   10/26/20 1034  Weight: 115 lb (52.2 kg)  Height: 4\' 11"  (1.499 m)   Body mass index is 23.23 kg/m.  Advanced Directives 10/26/2020 10/22/2019 10/17/2017 05/04/2015  Does Patient Have a Medical Advance Directive? Yes Yes Yes Yes  Type of 05/06/2015 of [place];Living will Healthcare Power of [place];Living will Living will Healthcare Power of [place];Living will  Does patient want to make changes to medical advance directive? No - Patient declined No - Patient declined No - Patient declined -  Copy of Healthcare Power of Attorney in Chart? No - copy requested No - copy requested - No - copy requested    Current Medications (verified) Outpatient Encounter Medications as of 10/26/2020  Medication Sig  . Calcium Citrate-Vitamin D (CAL-CITRATE PLUS VITAMIN D PO) Take 1 tablet by mouth daily.  10/28/2020 denosumab (PROLIA) 60 MG/ML SOLN injection Inject 60 mg into the skin every 6 (six) months. Administer in upper arm, thigh, or abdomen  . Multiple Vitamin (MULTIVITAMIN) tablet Take 1 tablet by mouth daily.  . simvastatin (ZOCOR) 20 MG tablet Take 1 tablet (20 mg total) by mouth daily at 6 PM.   No facility-administered encounter medications on file as of 10/26/2020.    Allergies (verified) Tramadol   History: Past Medical History:  Diagnosis Date  . History of colon polyps   . Hyperlipidemia   . Osteoporosis    Past Surgical History:  Procedure Laterality Date  . BREAST BIOPSY  2007   CORE W/CLIP - NEG   Family History  Problem Relation Age of Onset  . Arthritis Mother   . Alcohol abuse Father   . Heart disease Father 49       CAD - died  . Hyperlipidemia Brother   .  Diabetes Brother   . Hypertension Brother   . Diabetes Brother   . Hyperlipidemia Brother   . CAD Brother        quadruple bypass  . Diabetes Brother   . Breast cancer Neg Hx    Social History   Socioeconomic History  . Marital status: Single    Spouse name: Not on file  . Number of children: 1  . Years of education: 58  . Highest education level: Not on file  Occupational History  . Occupation: 15    Comment: Retired - Charity fundraiser  Tobacco Use  . Smoking status: Never Smoker  . Smokeless tobacco: Never Used  Substance and Sexual Activity  . Alcohol use: Yes    Comment: Occasional wine with dinner  . Drug use: No  . Sexual activity: Not Currently  Other Topics Concern  . Not on file  Social History Narrative   Hobbies: flower gardening, read, travel   Caffeine: 4 cups of coffee   Exercise: walk 3 miles on treadmill 3 a week, resistance exercises [employer]   Social Determinants of Health   Financial Resource Strain: Low Risk   . Difficulty of Paying Living Expenses: Not hard at all  Food Insecurity: No Food Insecurity  . Worried About Risk analyst in the Last Year: Never true  . Ran Out of Food in the Last Year: Never true  Transportation Needs: No Transportation Needs  . Lack of Transportation (Medical): No  . Lack of Transportation (Non-Medical): No  Physical Activity: Sufficiently Active  . Days of Exercise per Week: 7 days  . Minutes of Exercise per Session: 60 min  Stress: No Stress Concern Present  . Feeling of Stress : Only a little  Social Connections: Unknown  . Frequency of Communication with Friends and Family: More than three times a week  . Frequency of Social Gatherings with Friends and Family: Not on file  . Attends Religious Services: More than 4 times per year  . Active Member of Clubs or Organizations: Yes  . Attends Banker Meetings: More than 4 times per year  . Marital Status: Not on file    Tobacco  Counseling Counseling given: Not Answered   Clinical Intake:  Pre-visit preparation completed: Yes        Diabetes: No  How often do you need to have someone help you when you read instructions, pamphlets, or other written materials from your doctor or pharmacy?: 1 - Never    Interpreter Needed?: No      Activities of Daily Living In your present state of health, do you have any difficulty performing the following activities: 10/26/2020  Hearing? N  Vision? N  Difficulty concentrating or making decisions? N  Walking or climbing stairs? N  Dressing or bathing? N  Doing errands, shopping? N  Preparing Food and eating ? N  Using the Toilet? N  In the past six months, have you accidently leaked urine? N  Do you have problems with loss of bowel control? N  Managing your Medications? N  Managing your Finances? N  Housekeeping or managing your Housekeeping? N  Some recent data might be hidden    Patient Care Team: Allegra Grana, FNP as PCP - General (Family Medicine)  Indicate any recent Medical Services you may have received from other than [facility] providers in the past year (date may be approximate).     Assessment:   This is a routine wellness examination for Janet Shields.  I connected with Zaylei today by telephone and verified that I am speaking with the correct person using two identifiers. Location patient: home Location provider: work Persons participating in the virtual visit: patient, Engineer, civil (consulting).    I discussed the limitations, risks, security and privacy concerns of performing an evaluation and management service by telephone and the availability of in person appointments. The patient expressed understanding and verbally consented to this telephonic visit.    Interactive audio and video telecommunications were attempted between this provider and patient, however failed, due to patient having technical difficulties OR patient did not have access to video  capability.  We continued and completed visit with audio only.  Some vital signs may be absent or patient reported.   Hearing/Vision screen  Hearing Screening   125Hz  250Hz  500Hz  1000Hz  2000Hz  3000Hz  4000Hz  6000Hz  8000Hz   Right ear:           Left ear:           Comments: Patient is able to hear conversational tones without difficulty.  No issues reported.    Vision Screening Comments: Wears corrective lenses  Cataract extraction, bilateral  Wears sunglasses when in the sun.  Visual acuity not assessed, virtual visit.  They have seen their ophthalmologist.  Dietary issues and exercise activities discussed: Current Exercise Habits: Home exercise routine, Type of exercise: walking;stretching, Time (Minutes): 60, Frequency (Times/Week): 7, Weekly Exercise (Minutes/Week):  420, Intensity: Moderate  Regular diet Good water intake  Goals      Patient Stated   .  Maintain Healthy Lifestyle (pt-stated)      Stay active (12,000 steps daily) Healthy diet      Depression Screen PHQ 2/9 Scores 10/26/2020 10/22/2019 09/17/2019 02/14/2016 05/04/2015 03/24/2014 03/24/2014  PHQ - 2 Score 0 0 1 0 0 0 0  PHQ- 9 Score - 0 3 - - - -    Fall Risk Fall Risk  10/26/2020 12/06/2019 10/22/2019 09/17/2019 02/14/2016  Falls in the past year? 0 0 0 0 No  Number falls in past yr: 0 - - - -  Injury with Fall? 0 - - - -  Risk for fall due to : - - - - -  Risk for fall due to: Comment - - - - -  Follow up Falls evaluation completed Falls evaluation completed Falls evaluation completed Falls evaluation completed -  Comment - - - - -    FALL RISK PREVENTION PERTAINING TO THE HOME: Handrails in use when climbing stairs? Yes Home free of loose throw rugs in walkways, pet beds, electrical cords, etc? Yes  Adequate lighting in your home to reduce risk of falls? Yes   ASSISTIVE DEVICES UTILIZED TO PREVENT FALLS: Use of a cane, walker or w/c? No   TIMED UP AND GO: Was the test performed? No . Virtual  visit.  Cognitive Function: Patient is alert and oriented x3.  Denies difficulty focusing, making decisions, memory loss.  MMSE/6CIT deferred. Normal by direct communication/observation.  MMSE - Mini Mental State Exam 10/22/2019 05/04/2015  Not completed: Unable to complete -  Orientation to time - 5  Orientation to Place - 5  Registration - 3  Attention/ Calculation - 5  Recall - 3  Language- name 2 objects - 2  Language- repeat - 1  Language- follow 3 step command - 3  Language- read & follow direction - 1  Write a sentence - 1  Copy design - 1  Total score - 30        Immunizations Immunization History  Administered Date(s) Administered  . DTaP 10/17/2008  . Fluad Quad(high Dose 65+) 03/30/2019  . Influenza, High Dose Seasonal PF 04/28/2017  . Influenza-Unspecified 04/18/2016, 04/15/2017, 04/14/2020  . PFIZER(Purple Top)SARS-COV-2 Vaccination 08/10/2019, 08/31/2019, 05/01/2020  . Pneumococcal Conjugate-13 03/24/2014  . Pneumococcal Polysaccharide-23 12/23/2006  . Zoster 12/23/2006    TDAP status: Due, Education has been provided regarding the importance of this vaccine. Advised may receive this vaccine at local pharmacy or Health Dept. Aware to provide a copy of the vaccination record if obtained from local pharmacy or Health Dept. Verbalized acceptance and understanding.  Deferred.  Health Maintenance Health Maintenance  Topic Date Due  . TETANUS/TDAP  10/14/2016  . INFLUENZA VACCINE  02/12/2021  . DEXA SCAN  Completed  . COVID-19 Vaccine  Completed  . PNA vac Low Risk Adult  Completed  . HPV VACCINES  Aged Out    Colorectal cancer screening: No longer required.    Mammogram status: Completed 06/12/20. Repeat every year  Osteoporosis- prolia injection every 6 months. Followed by Dr. Rosaura CarpenterBlackwood.  Lung Cancer Screening: (Low Dose CT Chest recommended if Age 26-80 years, 30 pack-year currently smoking OR have quit w/in 15years.) does not qualify.   Hepatitis C  Screening: does not qualify.  Vision Screening: Recommended annual ophthalmology exams for early detection of glaucoma and other disorders of the eye. Is the patient up to date with  their annual eye exam?  Yes  Who is the provider or what is the name of the office in which the patient attends annual eye exams? [facility]  Dental Screening: Recommended annual dental exams for proper oral hygiene.  Community Resource Referral / Chronic Care Management: CRR required this visit?  No   CCM required this visit?  No      Plan:   Keep all routine maintenance appointments.   Agrees to schedule annual with pcp around 02/2021.  I have personally reviewed and noted the following in the patient's chart:   . Medical and social history . Use of alcohol, tobacco or illicit drugs  . Current medications and supplements . Functional ability and status . Nutritional status . Physical activity . Advanced directives . List of other physicians . Hospitalizations, surgeries, and ER visits in previous 12 months . Vitals . Screenings to include cognitive, depression, and falls . Referrals and appointments  In addition, I have reviewed and discussed with patient certain preventive protocols, quality metrics, and best practice recommendations. A written personalized care plan for preventive services as well as general preventive health recommendations were provided to patient via mychart.     Ashok Pall, LPN   [id number]    Agree with plan. [name], NP

## 2020-11-16 ENCOUNTER — Ambulatory Visit
Admission: EM | Admit: 2020-11-16 | Discharge: 2020-11-16 | Disposition: A | Discharge status: Home / Self Care | Payer: Medicare Other | Attending: Emergency Medicine | Admitting: Emergency Medicine

## 2020-11-16 ENCOUNTER — Encounter: Payer: Self-pay | Admitting: Emergency Medicine

## 2020-11-16 ENCOUNTER — Other Ambulatory Visit: Payer: Self-pay

## 2020-11-16 DIAGNOSIS — N3001 Acute cystitis with hematuria: Secondary | ICD-10-CM | POA: Insufficient documentation

## 2020-11-16 LAB — POCT URINALYSIS DIP (MANUAL ENTRY)
Bilirubin, UA: NEGATIVE
Glucose, UA: NEGATIVE mg/dL
Ketones, POC UA: NEGATIVE mg/dL
Nitrite, UA: NEGATIVE
Protein Ur, POC: 30 mg/dL — AB
Spec Grav, UA: 1.02 (ref 1.010–1.025)
Urobilinogen, UA: 0.2 E.U./dL
pH, UA: 7.5 (ref 5.0–8.0)

## 2020-11-16 MED ORDER — SULFAMETHOXAZOLE-TRIMETHOPRIM 800-160 MG PO TABS: 1.0000 | ORAL_TABLET | Freq: Two times a day (BID) | ORAL | 0 refills | Status: AC

## 2020-11-16 NOTE — ED Provider Notes (Signed)
[name]    CSN: [id number] Arrival date & time: 11/16/20  0272      History   Chief Complaint Chief Complaint  Patient presents with  . Urinary Tract Infection    HPI Janet Shields is a 85 y.o. female.   Patient presents with dysuria, urinary frequency, urinary urgency x1 day.  She reports sensation of abdominal bloating prior to the onset of her urine symptoms.  She denies hematuria, fever, chills, abdominal pain, flank pain, pelvic pain, or other symptoms.  No treatments attempted at home.  Patient was seen here on 09/29/2020; diagnosed with acute cystitis with hematuria; treated with Keflex; urine culture had insignificant growth.  She states she has had frequent urinary symptoms over the past year.  The history is provided by the patient and medical records.    Past Medical History:  Diagnosis Date  . History of colon polyps   . Hyperlipidemia   . Osteoporosis     Patient Active Problem List   Diagnosis Date Noted  . Cellulitis 01/25/2020  . Dysuria 12/06/2019  . Routine physical examination 02/25/2018  . Lymphadenopathy 02/25/2018  . Atherosclerosis of aorta (HCC) 10/22/2017  . Herpes zoster without complication 10/22/2017  . Screening for breast cancer 02/14/2016  . Rash and nonspecific skin eruption 02/14/2016  . HLD (hyperlipidemia) 02/24/2014  . Osteoporosis 02/24/2014    Past Surgical History:  Procedure Laterality Date  . BREAST BIOPSY  2007   CORE W/CLIP - NEG    OB History   No obstetric history on file.      Home Medications    Prior to Admission medications   Medication Sig Start Date End Date Taking? Authorizing Provider  Calcium Citrate-Vitamin D (CAL-CITRATE PLUS VITAMIN D PO) Take 1 tablet by mouth daily.   Yes [provider]  Multiple Vitamin (MULTIVITAMIN) tablet Take 1 tablet by mouth daily.   Yes [provider]  simvastatin (ZOCOR) 20 MG tablet Take 1 tablet (20 mg total) by mouth daily at 6 PM.  07/24/20  Yes Arnett, Lyn Records, FNP  sulfamethoxazole-trimethoprim (BACTRIM DS) 800-160 MG tablet Take 1 tablet by mouth 2 (two) times daily for 5 days. 11/16/20 11/21/20 Yes Mickie Bail, NP  denosumab (PROLIA) 60 MG/ML SOLN injection Inject 60 mg into the skin every 6 (six) months. Administer in upper arm, thigh, or abdomen    [provider]    Family History Family History  Problem Relation Age of Onset  . Arthritis Mother   . Alcohol abuse Father   . Heart disease Father 34       CAD - died  . Hyperlipidemia Brother   . Diabetes Brother   . Hypertension Brother   . Diabetes Brother   . Hyperlipidemia Brother   . CAD Brother        quadruple bypass  . Diabetes Brother   . Breast cancer Neg Hx     Social History Social History   Tobacco Use  . Smoking status: Never Smoker  . Smokeless tobacco: Never Used  Vaping Use  . Vaping Use: Never used  Substance Use Topics  . Alcohol use: Yes    Comment: Occasional wine with dinner  . Drug use: No     Allergies   Tramadol   Review of Systems Review of Systems  Constitutional: Negative for chills and fever.  Respiratory: Negative for cough and shortness of breath.   Cardiovascular: Negative for chest pain and palpitations.  Gastrointestinal: Negative for abdominal  pain and vomiting.  Genitourinary: Positive for dysuria, frequency and urgency. Negative for flank pain, hematuria and pelvic pain.  Skin: Negative for color change and rash.  All other systems reviewed and are negative.    Physical Exam Triage Vital Signs ED Triage Vitals  Enc Vitals Group     BP      Pulse      Resp      Temp      Temp src      SpO2      Weight      Height      Head Circumference      Peak Flow      Pain Score      Pain Loc      Pain Edu?      Excl. in GC?    No data found.  Updated Vital Signs BP 137/69 (BP Location: Left Arm)   Pulse 84   Temp 98 F (36.7 C) (Oral)   Resp 20   SpO2 98%   Visual  Acuity Right Eye Distance:   Left Eye Distance:   Bilateral Distance:    Right Eye Near:   Left Eye Near:    Bilateral Near:     Physical Exam Vitals and nursing note reviewed.  Constitutional:      General: She is not in acute distress.    Appearance: She is well-developed. She is not ill-appearing.  HENT:     Head: Normocephalic and atraumatic.     Mouth/Throat:     Mouth: Mucous membranes are moist.  Eyes:     Conjunctiva/sclera: Conjunctivae normal.  Cardiovascular:     Rate and Rhythm: Normal rate and regular rhythm.     Heart sounds: Normal heart sounds.  Pulmonary:     Effort: Pulmonary effort is normal. No respiratory distress.     Breath sounds: Normal breath sounds.  Abdominal:     General: Bowel sounds are normal. There is no distension.     Palpations: Abdomen is soft.     Tenderness: There is no abdominal tenderness. There is no right CVA tenderness, left CVA tenderness, guarding or rebound.  Musculoskeletal:     Cervical back: Neck supple.  Skin:    General: Skin is warm and dry.  Neurological:     General: No focal deficit present.     Mental Status: She is alert and oriented to person, place, and time.  Psychiatric:        Mood and Affect: Mood normal.        Behavior: Behavior normal.      UC Treatments / Results  Labs (all labs ordered are listed, but only abnormal results are displayed) Labs Reviewed  POCT URINALYSIS DIP (MANUAL ENTRY) - Abnormal; Notable for the following components:      Result Value   Clarity, UA cloudy (*)    Blood, UA large (*)    Protein Ur, POC =30 (*)    Leukocytes, UA Small (1+) (*)    All other components within normal limits  URINE CULTURE    EKG   Radiology No results found.  Procedures Procedures (including critical care time)  Medications Ordered in UC Medications - No data to display  Initial Impression / Assessment and Plan / UC Course  I have reviewed the triage vital signs and the nursing  notes.  Pertinent labs & imaging results that were available during my care of the patient were reviewed by me and considered in  my medical decision making (see chart for details).    Acute cystitis with hematuria.  Discussed last urine culture results.  Treating today with Septra DS.  Urine culture pending.  Discussed with patient that we will call her if it shows the need to change or discontinue the antibiotic.  Instructed patient to follow-up with her PCP or urologist to discuss her increased urinary symptoms over the past few months.  She agrees to plan of care.   Final Clinical Impressions(s) / UC Diagnoses   Final diagnoses:  Acute cystitis with hematuria     Discharge Instructions     Take the antibiotic as directed.  The urine culture is pending.  We will call you if it shows the need to change or discontinue your antibiotic.    Follow up with your primary care provider or a urologist.         ED Prescriptions    Medication Sig Dispense Auth. Provider   sulfamethoxazole-trimethoprim (BACTRIM DS) 800-160 MG tablet Take 1 tablet by mouth 2 (two) times daily for 5 days. 10 tablet Mickie Bail, NP     PDMP not reviewed this encounter.   Mickie Bail, NP 11/16/20 1012

## 2020-11-16 NOTE — Discharge Instructions (Signed)
Take the antibiotic as directed.  The urine culture is pending.  We will call you if it shows the need to change or discontinue your antibiotic.    Follow up with your primary care provider or a urologist.    

## 2020-11-16 NOTE — ED Triage Notes (Signed)
Symptoms started yesterday.  Reports frequency and stinging started yesterday.  Started urgency today

## 2020-11-17 LAB — URINE CULTURE: Culture: <10000 — AB

## 2020-12-04 ENCOUNTER — Ambulatory Visit (INDEPENDENT_AMBULATORY_CARE_PROVIDER_SITE_OTHER): Payer: Medicare Other | Admitting: Family

## 2020-12-04 ENCOUNTER — Encounter: Payer: Self-pay | Admitting: Family

## 2020-12-04 ENCOUNTER — Other Ambulatory Visit: Payer: Self-pay

## 2020-12-04 VITALS — BP 138/68 | HR 70 | Temp 98.2°F | Ht 59.0 in | Wt 116.4 lb

## 2020-12-04 DIAGNOSIS — R319 Hematuria, unspecified: Secondary | ICD-10-CM | POA: Diagnosis not present

## 2020-12-04 DIAGNOSIS — M810 Age-related osteoporosis without current pathological fracture: Secondary | ICD-10-CM | POA: Insufficient documentation

## 2020-12-04 LAB — URINALYSIS, ROUTINE W REFLEX MICROSCOPIC
Bilirubin Urine: NEGATIVE
Hgb urine dipstick: NEGATIVE
Ketones, ur: NEGATIVE
Leukocytes,Ua: NEGATIVE
Nitrite: NEGATIVE
RBC / HPF: NONE SEEN (ref 0–?)
Specific Gravity, Urine: 1.015 (ref 1.000–1.030)
Total Protein, Urine: NEGATIVE
Urine Glucose: NEGATIVE
Urobilinogen, UA: 0.2 (ref 0.0–1.0)
WBC, UA: NONE SEEN (ref 0–?)
pH: 6.5 (ref 5.0–8.0)

## 2020-12-04 LAB — COMPREHENSIVE METABOLIC PANEL
ALT: 18 U/L (ref 0–35)
AST: 22 U/L (ref 0–37)
Albumin: 4.4 g/dL (ref 3.5–5.2)
Alkaline Phosphatase: 30 U/L — ABNORMAL LOW (ref 39–117)
BUN: 12 mg/dL (ref 6–23)
CO2: 28 mEq/L (ref 19–32)
Calcium: 10 mg/dL (ref 8.4–10.5)
Chloride: 103 mEq/L (ref 96–112)
Creatinine, Ser: 0.61 mg/dL (ref 0.40–1.20)
GFR: 83.16 mL/min (ref >60.00)
Glucose, Bld: 94 mg/dL (ref 70–99)
Potassium: 3.6 mEq/L (ref 3.5–5.1)
Sodium: 140 mEq/L (ref 135–145)
Total Bilirubin: 0.5 mg/dL (ref 0.2–1.2)
Total Protein: 7.4 g/dL (ref 6.0–8.3)

## 2020-12-04 LAB — VITAMIN D 25 HYDROXY (VIT D DEFICIENCY, FRACTURES): VITD: 47.8 ng/mL (ref 30.00–100.00)

## 2020-12-04 NOTE — Progress Notes (Signed)
Subjective:    Patient ID: Janet Shields, female    DOB: Jan 11, 1936, 85 y.o.   MRN: [id number]  CC: Janet Shields is a 85 y.o. female who presents today for follow up.   HPI: Reports 3 weeks ago with slight burning with urination, urinary urgency, bloating and constipation. She presented to [facility] where she was treated empirically for UTI. All symptoms have resolved as of today.   She reports that abdominal bloating presented 3 weeks ago however was no present in the last 6 months since first UTI in my years.   No fever, nausea , vomiting, abdominal pain, flank pain,  Blood in stool, straining to have BM.    7 months ago UTI with ecoli proven by culture.   Walks for 3 miles daily on treadmill.   No h/o abdominal surgeries . [facility] for urinary frequency 11/16/20 ( treated with septra,urine culture with no significant growth, hematuria) and 09/29/20 ( treated with keflex , urine culture didn't show significant growth)  Osteoporosis- compliant with prolia which is prescribed by Janet Shields  Mammogram UTD  Colonscopy 14 years ago reported normal.   HISTORY:  Past Medical History:  Diagnosis Date  . History of colon polyps   . Hyperlipidemia   . Osteoporosis    Past Surgical History:  Procedure Laterality Date  . BREAST BIOPSY  2007   CORE W/CLIP - NEG   Family History  Problem Relation Age of Onset  . Arthritis Mother   . Alcohol abuse Father   . Heart disease Father 31       CAD - died  . Hyperlipidemia Brother   . Diabetes Brother   . Hypertension Brother   . Diabetes Brother   . Hyperlipidemia Brother   . CAD Brother        quadruple bypass  . Diabetes Brother   . Breast cancer Neg Hx     Allergies: Tramadol Current Outpatient Medications on File Prior to Visit  Medication Sig Dispense Refill  . Calcium Citrate-Vitamin D (CAL-CITRATE PLUS VITAMIN D PO) Take 1 tablet by mouth daily.    Marland Kitchen denosumab (PROLIA) 60 MG/ML SOLN injection Inject 60 mg  into the skin every 6 (six) months. Administer in upper arm, thigh, or abdomen    . Multiple Vitamin (MULTIVITAMIN) tablet Take 1 tablet by mouth daily.    . simvastatin (ZOCOR) 20 MG tablet Take 1 tablet (20 mg total) by mouth daily at 6 PM. 30 tablet 5   No current facility-administered medications on file prior to visit.    Social History   Tobacco Use  . Smoking status: Never Smoker  . Smokeless tobacco: Never Used  Vaping Use  . Vaping Use: Never used  Substance Use Topics  . Alcohol use: Yes    Comment: Occasional wine with dinner  . Drug use: No    Review of Systems  Constitutional: Negative for chills and fever.  Respiratory: Negative for cough.   Cardiovascular: Negative for chest pain and palpitations.  Gastrointestinal: Negative for abdominal distention, abdominal pain, nausea and vomiting.  Genitourinary: Negative for difficulty urinating, flank pain, frequency and hematuria.      Objective:    BP 138/68 (BP Location: Left Arm, Patient Position: Sitting, Cuff Size: Normal)   Pulse 70   Temp 98.2 F (36.8 C) (Oral)   Ht 4\' 11"  (1.499 m)   Wt 116 lb 6.4 oz (52.8 kg)   SpO2 99%   BMI 23.51 kg/m  BP  Readings from Last 3 Encounters:  12/04/20 138/68  11/16/20 137/69  09/29/20 131/76   Wt Readings from Last 3 Encounters:  12/04/20 116 lb 6.4 oz (52.8 kg)  10/26/20 115 lb (52.2 kg)  09/29/20 115 lb (52.2 kg)    Physical Exam Vitals reviewed.  Constitutional:      Appearance: Normal appearance. She is well-developed.  Eyes:     Conjunctiva/sclera: Conjunctivae normal.  Cardiovascular:     Rate and Rhythm: Normal rate and regular rhythm.     Pulses: Normal pulses.     Heart sounds: Normal heart sounds.  Pulmonary:     Effort: Pulmonary effort is normal.     Breath sounds: Normal breath sounds. No wheezing, rhonchi or rales.  Abdominal:     General: Bowel sounds are normal. There is no distension.     Palpations: Abdomen is soft. Abdomen is not  rigid. There is no fluid wave or mass.     Tenderness: There is no abdominal tenderness. There is no guarding or rebound.  Skin:    General: Skin is warm and dry.  Neurological:     Mental Status: She is alert.  Psychiatric:        Speech: Speech normal.        Behavior: Behavior normal.        Thought Content: Thought content normal.        Assessment & Plan:   Problem List Items Addressed This Visit      Other   Hematuria - Primary    Benign exam. Symptoms resolved today however discussed recurrent nature of dysuria and episodic abdominal bloating 3 weeks ago.  Pending labs, imaging in setting of persistent hematuria and symptoms.  Referral to Janet Shields.        Relevant Orders   Urinalysis, Routine w reflex microscopic   Ambulatory referral to Urology   Fecal occult blood, imunochemical   US Renal   US Pelvic Complete With Transvaginal   Comprehensive metabolic panel   VITAMIN D 25 Hydroxy (Vit-D Deficiency, Fractures)   CA 125       I am having Janet Shields maintain her denosumab, multivitamin, Calcium Citrate-Vitamin D (CAL-CITRATE PLUS VITAMIN D PO), and simvastatin.   No orders of the defined types were placed in this encounter.   Return precautions given.   Risks, benefits, and alternatives of the medications and treatment plan prescribed today were discussed, and patient expressed understanding.   Education regarding symptom management and diagnosis given to patient on AVS.  Continue to follow with Janet Grana, FNP for routine health maintenance.   Janet Shields and I agreed with plan.   Janet Plowman, FNP

## 2020-12-04 NOTE — Assessment & Plan Note (Addendum)
Benign exam. Symptoms resolved today however discussed recurrent nature of dysuria and episodic abdominal bloating 3 weeks ago.  Pending labs, imaging in setting of persistent hematuria and symptoms.  Referral to Dr Apolinar Junes.

## 2020-12-04 NOTE — Patient Instructions (Addendum)
Look into D Mannose.  Referral to Dr [name], urology, ultrasound of pelvis and renal   Let us know if you dont hear back within a week in regards to an appointment being scheduled.   Please return stool cards   Let me know of any concerns

## 2020-12-05 ENCOUNTER — Telehealth: Payer: Self-pay | Admitting: Family

## 2020-12-05 LAB — CA 125: CA 125: 8 U/mL (ref <35)

## 2020-12-05 NOTE — Telephone Encounter (Signed)
lft vm for pt to call ofc to sch US renal and US pelvic. thanks

## 2020-12-06 ENCOUNTER — Other Ambulatory Visit (INDEPENDENT_AMBULATORY_CARE_PROVIDER_SITE_OTHER): Payer: Medicare Other

## 2020-12-06 DIAGNOSIS — R319 Hematuria, unspecified: Secondary | ICD-10-CM

## 2020-12-06 LAB — FECAL OCCULT BLOOD, IMMUNOCHEMICAL: Fecal Occult Bld: NEGATIVE

## 2020-12-14 ENCOUNTER — Other Ambulatory Visit: Payer: Self-pay

## 2020-12-14 ENCOUNTER — Encounter: Payer: Self-pay | Admitting: Urology

## 2020-12-14 ENCOUNTER — Ambulatory Visit: Payer: Medicare Other | Admitting: Urology

## 2020-12-14 VITALS — BP 130/73 | HR 76 | Ht 59.0 in | Wt 114.0 lb

## 2020-12-14 DIAGNOSIS — R3129 Other microscopic hematuria: Secondary | ICD-10-CM

## 2020-12-14 DIAGNOSIS — R319 Hematuria, unspecified: Secondary | ICD-10-CM

## 2020-12-14 DIAGNOSIS — N390 Urinary tract infection, site not specified: Secondary | ICD-10-CM

## 2020-12-14 MED ORDER — ESTRADIOL 0.1 MG/GM VA CREA: TOPICAL_CREAM | VAGINAL | 12 refills | Status: DC

## 2020-12-14 NOTE — Patient Instructions (Signed)
Take daily cranberry tables and priobiotics.    Estrogen Cream Instruction  Discard applicator  Apply pea sized amount to tip of finger to urethra before bed. Wash hands well after application. Use Monday, Wednesday and Friday

## 2020-12-14 NOTE — Progress Notes (Signed)
12/14/2020 11:11 AM   Thelma Comp Nov 28, 1935 [id number]  Referring provider: Allegra Grana, FNP [address] [place],  [place] [address]  Chief Complaint  Patient presents with  . Hematuria    HPI: 85 year old retired [employer] fund[employer] who presents today for further evaluation of microscopic hematuria.  She had a true urinary tract infection growing E. coli in November 2022 with traditional UTI symptoms including urgency, frequency and dysuria.  She had a recurrent event for which she was seen in urgent care on 09/29/2020.  Her symptoms were remarkably similar to this.  Her urine was also frankly positive at the time.  On this instance however, her urine culture grew mixed flora.  She was treated for presumed UTI.  She had recurrent episode again in Nov 16, 2020.  Her presentation was almost identical to the one in March.  Again, her urine was frankly positive with both leukocytes as well as RBCs and bacteria but her urine culture was negative.  Symptoms again improved after antibiotics.  She has been doing well since then.  She had follow-up with her primary care provider, [name] in late May at which time her urine was completely negative without any microscopic blood.  Her urine today is also completely negative.  She is asymptomatic today.  She has been having a little bit of abdominal constipation/bloating.  She treats her constipation as needed but does not have significant issues from this.  She does note some vaginal dryness. No other vaginal symptoms.  She reports today that her primary care has ordered a transvaginal ultrasound for her bloat type feeling.  She wonders whether or not it makes sense to pursue this.   PMH: Past Medical History:  Diagnosis Date  . History of colon polyps   . Hyperlipidemia   . Osteoporosis     Surgical History: Past Surgical History:  Procedure Laterality Date  . BREAST BIOPSY  2007   CORE W/CLIP - NEG    Home  Medications:  Allergies as of 12/14/2020      Reactions   Tramadol Nausea And Vomiting      Medication List       Accurate as of December 14, 2020 11:11 AM. If you have any questions, ask your nurse or doctor.        CAL-CITRATE PLUS VITAMIN D PO Take 1 tablet by mouth daily.   denosumab 60 MG/ML Soln injection Commonly known as: PROLIA Inject 60 mg into the skin every 6 (six) months. Administer in upper arm, thigh, or abdomen   multivitamin tablet Take 1 tablet by mouth daily.   simvastatin 20 MG tablet Commonly known as: ZOCOR Take 1 tablet (20 mg total) by mouth daily at 6 PM.       Allergies:  Allergies  Allergen Reactions  . Tramadol Nausea And Vomiting    Family History: Family History  Problem Relation Age of Onset  . Arthritis Mother   . Alcohol abuse Father   . Heart disease Father 44       CAD - died  . Hyperlipidemia Brother   . Diabetes Brother   . Hypertension Brother   . Diabetes Brother   . Hyperlipidemia Brother   . CAD Brother        quadruple bypass  . Diabetes Brother   . Breast cancer Neg Hx     Social History:  reports that she has never smoked. She has never used smokeless tobacco. She reports current alcohol use.  She reports that she does not use drugs.   Physical Exam: BP 130/73   Pulse 76   Ht 4\' 11"  (1.499 m)   Wt 114 lb (51.7 kg)   BMI 23.03 kg/m   Constitutional:  Alert and oriented, No acute distress. HEENT: [place] AT, moist mucus membranes.  Trachea midline, no masses. Cardiovascular: No clubbing, cyanosis, or edema. Respiratory: Normal respiratory effort, no increased work of breathing. Skin: No rashes, bruises or suspicious lesions. Neurologic: Grossly intact, no focal deficits, moving all 4 extremities. Psychiatric: Normal mood and affect.  Laboratory Data: Lab Results  Component Value Date   WBC 4.1 09/17/2019   HGB 14.2 09/17/2019   HCT 42.3 09/17/2019   MCV 89.4 09/17/2019   PLT 233.0 09/17/2019    Lab Results   Component Value Date   CREATININE 0.61 12/04/2020     Lab Results  Component Value Date   HGBA1C 6.0 02/26/2018    Urinalysis Results for orders placed or performed in visit on 12/14/20  Microscopic Examination   Urine  Result Value Ref Range   WBC, UA 0-5 0 - 5 /hpf   RBC None seen 0 - 2 /hpf   Epithelial Cells (non renal) None seen 0 - 10 /hpf   Casts Present (A) None seen /lpf   Cast Type Granular casts (A) N/A   Crystals Present (A) N/A   Crystal Type Amorphous Sediment N/A   Bacteria, UA None seen None seen/Few  Urinalysis, Complete  Result Value Ref Range   Specific Gravity, UA 1.020 1.005 - 1.030   pH, UA 7.0 5.0 - 7.5   Color, UA Yellow Yellow   Appearance Ur Clear Clear   Leukocytes,UA Negative Negative   Protein,UA Negative Negative/Trace   Glucose, UA Negative Negative   Ketones, UA Negative Negative   RBC, UA Negative Negative   Bilirubin, UA Negative Negative   Urobilinogen, Ur 0.2 0.2 - 1.0 mg/dL   Nitrite, UA Negative Negative   Microscopic Examination See below:      Assessment & Plan:    1. Recurrent UTI Episodic symptoms consistent with UTI x3 over the past year although only one has been culture positive  I suspect the other 2 were also infections, but for what ever reason the culture did not grow the bacteria based on the fact that her symptoms were pathognomonic for UTI, urine was frankly positive and then resolved with antibiotics quickly  We discussed management of recurrent UTIs in postmenopausal women.  She will start taking cranberry tablets, daily probiotics, and we discussed the role of topical estrogen cream as it relates to recurrent infections in postmenopausal women.  We discussed the risk of this which are fairly low given very low systemic absorption.  She will use a small pea-sized amount per urethral meatus 3 times a week.  Lastly, would like her to be seen and evaluated same day or next day if and when she has recurrent urinary  tract symptoms.  This will help track her symptoms, ensure that she is being adequately treated, as well as assess whether or not there is some other underlying condition necessitating further evaluation such as cystoscopy if these are in fact not infections.  She is agreeable this plan. - Urinalysis, Complete  2. Microscopic hematuria See above discussion, occasions of microscopic hematuria only present during acute cystitis episodes, negative x2 in the absence of symptoms  As such, will defer microscopic hematuria evaluation at this time but consider in the near future if  her symptoms recurred  In regards to pelvic ultrasound, I have deferred this to her primary care provider    [name], MD  [facility] [address], [address] [place], [place] [address] [phone]

## 2020-12-15 ENCOUNTER — Telehealth: Payer: Self-pay | Admitting: Family

## 2020-12-15 LAB — URINALYSIS, COMPLETE
Bilirubin, UA: NEGATIVE
Glucose, UA: NEGATIVE
Ketones, UA: NEGATIVE
Leukocytes,UA: NEGATIVE
Nitrite, UA: NEGATIVE
Protein,UA: NEGATIVE
RBC, UA: NEGATIVE
Specific Gravity, UA: 1.02 (ref 1.005–1.030)
Urobilinogen, Ur: 0.2 mg/dL (ref 0.2–1.0)
pH, UA: 7 (ref 5.0–7.5)

## 2020-12-15 LAB — MICROSCOPIC EXAMINATION
Bacteria, UA: NONE SEEN
Epithelial Cells (non renal): NONE SEEN /hpf (ref 0–10)
RBC, Urine: NONE SEEN /hpf (ref 0–2)

## 2020-12-15 NOTE — Telephone Encounter (Signed)
lft pt vm to call ofc to sch US renal and US pelvic.thanks

## 2020-12-18 ENCOUNTER — Other Ambulatory Visit: Payer: Self-pay | Admitting: Family Medicine

## 2020-12-18 MED ORDER — ESTRADIOL 0.1 MG/GM VA CREA: TOPICAL_CREAM | VAGINAL | 12 refills | Status: DC

## 2021-01-19 ENCOUNTER — Other Ambulatory Visit: Payer: Self-pay

## 2021-01-19 ENCOUNTER — Ambulatory Visit (INDEPENDENT_AMBULATORY_CARE_PROVIDER_SITE_OTHER): Payer: Medicare Other | Admitting: Family

## 2021-01-19 ENCOUNTER — Other Ambulatory Visit: Payer: Self-pay | Admitting: Family

## 2021-01-19 ENCOUNTER — Encounter: Payer: Self-pay | Admitting: Family

## 2021-01-19 VITALS — BP 128/62 | HR 72 | Temp 97.4°F | Ht 59.0 in | Wt 115.0 lb

## 2021-01-19 DIAGNOSIS — E785 Hyperlipidemia, unspecified: Secondary | ICD-10-CM | POA: Diagnosis not present

## 2021-01-19 DIAGNOSIS — R319 Hematuria, unspecified: Secondary | ICD-10-CM

## 2021-01-19 DIAGNOSIS — M810 Age-related osteoporosis without current pathological fracture: Secondary | ICD-10-CM | POA: Diagnosis not present

## 2021-01-19 NOTE — Assessment & Plan Note (Signed)
Resolved. She has seen urology for the same. She declines US pelvic in the absence of symptoms.

## 2021-01-19 NOTE — Progress Notes (Signed)
Subjective:    Patient ID: Janet Shields, female    DOB: [birth year], 85 y.o.   MRN: [id number]  CC: Janet Shields is a 85 y.o. female who presents today for follow up.   HPI: Feels well today No new complaints  She has seen Dr Apolinar Junes, urology regarding recurrent UTI.She has started cranberry supplement and probiotics. She has also started estrace vaginal cream.  No dysuria, urinary frequency.   She has decided not to pursue the Pelvis US. Abdominal distention has resolved. No abdominal pain.    Osteoporosis- following with Starla Link for Prolia. Seen  last month   HTN- compliant with zocor.  HISTORY:  Past Medical History:  Diagnosis Date   History of colon polyps    Hyperlipidemia    Osteoporosis    Past Surgical History:  Procedure Laterality Date   BREAST BIOPSY  2007   CORE W/CLIP - NEG   Family History  Problem Relation Age of Onset   Arthritis Mother    Alcohol abuse Father    Heart disease Father 74       CAD - died   Hyperlipidemia Brother    Diabetes Brother    Hypertension Brother    Diabetes Brother    Hyperlipidemia Brother    CAD Brother        quadruple bypass   Diabetes Brother    Breast cancer Neg Hx     Allergies: Tramadol Current Outpatient Medications on File Prior to Visit  Medication Sig Dispense Refill   Calcium Citrate-Vitamin D (CAL-CITRATE PLUS VITAMIN D PO) Take 1 tablet by mouth daily.     denosumab (PROLIA) 60 MG/ML SOLN injection Inject 60 mg into the skin every 6 (six) months. Administer in upper arm, thigh, or abdomen     estradiol (ESTRACE) 0.1 MG/GM vaginal cream Estrogen Cream Instruction  Discard applicator  Apply pea sized amount to tip of finger to urethra before bed. Wash hands well after application. Use Monday, Wednesday and Friday 42.5 g 12   Multiple Vitamin (MULTIVITAMIN) tablet Take 1 tablet by mouth daily.     No current facility-administered medications on file prior to visit.    Social History    Tobacco Use   Smoking status: Never   Smokeless tobacco: Never  Vaping Use   Vaping Use: Never used  Substance Use Topics   Alcohol use: Yes    Comment: Occasional wine with dinner   Drug use: No    Review of Systems  Constitutional:  Negative for chills and fever.  Respiratory:  Negative for cough.   Cardiovascular:  Negative for chest pain and palpitations.  Gastrointestinal:  Negative for abdominal distention, abdominal pain, nausea and vomiting.  Genitourinary:  Negative for dysuria and urgency.     Objective:    BP 128/62 (BP Location: Left Arm, Patient Position: Sitting, Cuff Size: Normal)   Pulse 72   Temp (!) 97.4 F (36.3 C) (Oral)   Ht 4\' 11"  (1.499 m)   Wt 115 lb (52.2 kg)   SpO2 98%   BMI 23.23 kg/m  BP Readings from Last 3 Encounters:  01/19/21 128/62  12/14/20 130/73  12/04/20 138/68   Wt Readings from Last 3 Encounters:  01/19/21 115 lb (52.2 kg)  12/14/20 114 lb (51.7 kg)  12/04/20 116 lb 6.4 oz (52.8 kg)    Physical Exam Vitals reviewed.  Constitutional:      Appearance: She is well-developed.  Eyes:     Conjunctiva/sclera: Conjunctivae normal.  Cardiovascular:  Rate and Rhythm: Normal rate and regular rhythm.     Pulses: Normal pulses.     Heart sounds: Normal heart sounds.  Pulmonary:     Effort: Pulmonary effort is normal.     Breath sounds: Normal breath sounds. No wheezing, rhonchi or rales.  Skin:    General: Skin is warm and dry.  Neurological:     Mental Status: She is alert.  Psychiatric:        Speech: Speech normal.        Behavior: Behavior normal.        Thought Content: Thought content normal.       Assessment & Plan:   Problem List Items Addressed This Visit       Musculoskeletal and Integument   Osteoporosis     Other   Hematuria    Resolved. She has seen urology for the same. She declines US pelvic in the absence of symptoms.        HLD (hyperlipidemia) - Primary    Updating lipid panel. Continue  zocor 20mg        Relevant Orders   Lipid panel   Hemoglobin A1c     I am having maintain her denosumab, multivitamin, Calcium Citrate-Vitamin D (CAL-CITRATE PLUS VITAMIN D PO), estradiol, and simvastatin.   No orders of the defined types were placed in this encounter.   Return precautions given.   Risks, benefits, and alternatives of the medications and treatment plan prescribed today were discussed, and patient expressed understanding.   Education regarding symptom management and diagnosis given to patient on AVS.  Continue to follow with Thelma Comp, FNP for routine health maintenance.   Allegra Grana and I agreed with plan.   Thelma Comp, FNP

## 2021-01-19 NOTE — Assessment & Plan Note (Signed)
Updating lipid panel. Continue zocor 20mg 

## 2021-01-22 ENCOUNTER — Other Ambulatory Visit (INDEPENDENT_AMBULATORY_CARE_PROVIDER_SITE_OTHER): Payer: Medicare Other

## 2021-01-22 ENCOUNTER — Other Ambulatory Visit: Payer: Self-pay

## 2021-01-22 DIAGNOSIS — E785 Hyperlipidemia, unspecified: Secondary | ICD-10-CM

## 2021-01-22 LAB — LIPID PANEL
Cholesterol: 176 mg/dL (ref 0–200)
HDL: 67.9 mg/dL (ref >39.00)
LDL Cholesterol: 96 mg/dL (ref 0–99)
NonHDL: 108.2
Total CHOL/HDL Ratio: 3
Triglycerides: 63 mg/dL (ref 0.0–149.0)
VLDL: 12.6 mg/dL (ref 0.0–40.0)

## 2021-01-22 LAB — HEMOGLOBIN A1C: Hgb A1c MFr Bld: 6.1 % (ref 4.6–6.5)

## 2021-04-05 ENCOUNTER — Other Ambulatory Visit: Payer: Self-pay | Admitting: Family

## 2021-04-05 DIAGNOSIS — Z1231 Encounter for screening mammogram for malignant neoplasm of breast: Secondary | ICD-10-CM

## 2021-06-18 ENCOUNTER — Ambulatory Visit
Admission: RE | Admit: 2021-06-18 | Discharge: 2021-06-18 | Disposition: A | Discharge status: Home / Self Care | Payer: Medicare Other | Source: Ambulatory Visit | Attending: Family | Admitting: Family

## 2021-06-18 ENCOUNTER — Other Ambulatory Visit: Payer: Self-pay

## 2021-06-18 DIAGNOSIS — Z1231 Encounter for screening mammogram for malignant neoplasm of breast: Secondary | ICD-10-CM | POA: Insufficient documentation

## 2021-07-15 ENCOUNTER — Other Ambulatory Visit: Payer: Self-pay | Admitting: Family

## 2021-07-15 DIAGNOSIS — E785 Hyperlipidemia, unspecified: Secondary | ICD-10-CM

## 2021-08-27 ENCOUNTER — Ambulatory Visit: Payer: Medicare Other | Admitting: Dermatology

## 2021-10-29 ENCOUNTER — Ambulatory Visit (INDEPENDENT_AMBULATORY_CARE_PROVIDER_SITE_OTHER): Payer: Medicare Other

## 2021-10-29 VITALS — Ht 59.0 in | Wt 115.0 lb

## 2021-10-29 DIAGNOSIS — Z0000 Encounter for general adult medical examination without abnormal findings: Secondary | ICD-10-CM | POA: Diagnosis not present

## 2021-10-29 NOTE — Patient Instructions (Addendum)
?  Janet Shields , ?Thank you for taking time to come for your Medicare Wellness Visit. I appreciate your ongoing commitment to your health goals. Please review the following plan we discussed and let me know if I can assist you in the future.  ? ?These are the goals we discussed: ? Goals   ? ?  ? Patient Stated  ?   Maintain Healthy Lifestyle (pt-stated)   ?   Stay active (12,000 steps daily) ?Healthy diet ?  ? ?  ?  ?This is a list of the screening recommended for you and due dates:  ?Health Maintenance  ?Topic Date Due  ? COVID-19 Vaccine (4 - Booster for Pfizer series) 11/14/2021*  ? Zoster (Shingles) Vaccine (1 of 2) 01/28/2022*  ? Tetanus Vaccine  10/30/2022*  ? Flu Shot  02/12/2022  ? Pneumonia Vaccine  Completed  ? DEXA scan (bone density measurement)  Completed  ? HPV Vaccine  Aged Out  ?*Topic was postponed. The date shown is not the original due date.  ?  ?

## 2021-10-29 NOTE — Progress Notes (Signed)
Subjective:   Janet Shields is a 86 y.o. female who presents for Medicare Annual (Subsequent) preventive examination.  Review of Systems    No ROS.  Medicare Wellness Virtual Visit.  Visual/audio telehealth visit, UTA vital signs.   See social history for additional risk factors.   Cardiac Risk Factors include: advanced age (>81men, >49 women)     Objective:    Today's Vitals   10/29/21 1034  Weight: 115 lb (52.2 kg)  Height: 4\' 11"  (1.499 m)   Body mass index is 23.23 kg/m.     10/29/2021   10:39 AM 10/26/2020   11:03 AM 10/22/2019    1:52 PM 10/17/2017    4:59 PM 05/04/2015    1:25 PM  Advanced Directives  Does Patient Have a Medical Advance Directive? Yes Yes Yes Yes Yes  Type of Estate agent of [place];Living will Healthcare Power of [place];Living will Healthcare Power of [place];Living will Living will Healthcare Power of [place];Living will  Does patient want to make changes to medical advance directive? No - Patient declined No - Patient declined No - Patient declined No - Patient declined   Copy of Healthcare Power of Attorney in Chart? No - copy requested No - copy requested No - copy requested  No - copy requested    Current Medications (verified) Outpatient Encounter Medications as of 10/29/2021  Medication Sig   Calcium Citrate-Vitamin D (CAL-CITRATE PLUS VITAMIN D PO) Take 1 tablet by mouth daily.   denosumab (PROLIA) 60 MG/ML SOLN injection Inject 60 mg into the skin every 6 (six) months. Administer in upper arm, thigh, or abdomen   estradiol (ESTRACE) 0.1 MG/GM vaginal cream Estrogen Cream Instruction  Discard applicator  Apply pea sized amount to tip of finger to urethra before bed. Wash hands well after application. Use Monday, Wednesday and Friday   Multiple Vitamin (MULTIVITAMIN) tablet Take 1 tablet by mouth daily.   simvastatin (ZOCOR) 20 MG tablet TAKE 1 TABLET(20 MG) BY MOUTH DAILY AT 6 PM   No facility-administered  encounter medications on file as of 10/29/2021.    Allergies (verified) Tramadol   History: Past Medical History:  Diagnosis Date   History of colon polyps    Hyperlipidemia    Osteoporosis    Past Surgical History:  Procedure Laterality Date   BREAST BIOPSY  2007   CORE W/CLIP - NEG   Family History  Problem Relation Age of Onset   Arthritis Mother    Alcohol abuse Father    Heart disease Father 19       CAD - died   Hyperlipidemia Brother    Diabetes Brother    Hypertension Brother    Diabetes Brother    Hyperlipidemia Brother    CAD Brother        quadruple bypass   Diabetes Brother    Breast cancer Neg Hx    Social History   Socioeconomic History   Marital status: Single    Spouse name: Not on file   Number of children: 1   Years of education: 18   Highest education level: Not on file  Occupational History   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  Tobacco Use   Smoking status: Never   Smokeless tobacco: Never  Vaping Use   Vaping Use: Never used  Substance and Sexual Activity   Alcohol use: Yes    Comment: Occasional wine with dinner   Drug use: No   Sexual activity: Not  Currently  Other Topics Concern   Not on file  Social History Narrative   Hobbies: flower gardening, read, travel   Caffeine: 4 cups of coffee   Exercise: walk 3 miles on treadmill 3 a week, resistance exercises [employer]   Social Determinants of Health   Financial Resource Strain: Low Risk    Difficulty of Paying Living Expenses: Not hard at all  Food Insecurity: No Food Insecurity   Worried About Programme researcher, broadcasting/film/video in the Last Year: Never true   Barista in the Last Year: Never true  Transportation Needs: No Transportation Needs   Lack of Transportation (Medical): No   Lack of Transportation (Non-Medical): No  Physical Activity: Sufficiently Active   Days of Exercise per Week: 5 days   Minutes of Exercise per Session: 60 min  Stress: No Stress Concern  Present   Feeling of Stress : Only a little  Social Connections: Unknown   Frequency of Communication with Friends and Family: More than three times a week   Frequency of Social Gatherings with Friends and Family: More than three times a week   Attends Religious Services: More than 4 times per year   Active Member of [employer] or Organizations: Yes   Attends Engineer, structural: More than 4 times per year   Marital Status: Not on file    Tobacco Counseling Counseling given: Not Answered   Clinical Intake:  Pre-visit preparation completed: Yes        Diabetes: No  How often do you need to have someone help you when you read instructions, pamphlets, or other written materials from your doctor or pharmacy?: 1 - Never  Interpreter Needed?: No      Activities of Daily Living    10/29/2021   10:37 AM  In your present state of health, do you have any difficulty performing the following activities:  Hearing? 0  Vision? 0  Difficulty concentrating or making decisions? 0  Walking or climbing stairs? 0  Dressing or bathing? 0  Doing errands, shopping? 0  Preparing Food and eating ? N  Using the Toilet? N  In the past six months, have you accidently leaked urine? N  Do you have problems with loss of bowel control? N  Managing your Medications? N  Managing your Finances? N  Housekeeping or managing your Housekeeping? N    Patient Care Team: Allegra Grana, FNP as PCP - General (Family Medicine)  Indicate any recent Medical Services you may have received from other than [facility] providers in the past year (date may be approximate).     Assessment:   This is a routine wellness examination for Janet Shields.  Virtual Visit via Telephone Note  I connected with  Janet Shields on 10/29/21 at 10:30 AM EDT by telephone and verified that I am speaking with the correct person using two identifiers.  Persons participating in the virtual visit: patient/Nurse Health Advisor    I discussed the limitations of performing an evaluation and management service by telehealth.  The patient expressed understanding and agreed to proceed. Some vital signs may be absent or patient reported.   Hearing/Vision screen Hearing Screening - Comments:: Patient notes some loss with conversational tones. Followed by ENT. Does not wear hearing aids.  Vision Screening - Comments:: Cataract extraction, bilateral  Wears sunglasses when in the sun.  Glaucoma; drops in use.  Seen their ophthalmologist every 4-6 months.  Dietary issues and exercise activities discussed: Current Exercise Habits: Structured exercise  class, Type of exercise: walking, Time (Minutes): 45, Frequency (Times/Week): 5, Weekly Exercise (Minutes/Week): 225, Intensity: Mild Regular diet    Goals Addressed               This Visit's Progress     Patient Stated     Maintain Healthy Lifestyle (pt-stated)   On track     Stay active (12,000 steps daily) Healthy diet       Depression Screen    10/29/2021   10:50 AM 12/04/2020    8:53 AM 10/26/2020   10:41 AM 10/22/2019    2:08 PM 09/17/2019    9:15 AM 02/14/2016   11:28 AM 05/04/2015    1:30 PM  PHQ 2/9 Scores  PHQ - 2 Score 0 0 0 0 1 0 0  PHQ- 9 Score  1  0 3      Fall Risk    10/29/2021   10:50 AM 01/19/2021    4:07 PM 10/26/2020   10:45 AM 12/06/2019    8:16 AM 10/22/2019    2:55 PM  Fall Risk   Falls in the past year? 0 0 0 0 0  Number falls in past yr: 0  0    Injury with Fall?   0    Follow up Falls evaluation completed Falls evaluation completed Falls evaluation completed Falls evaluation completed Falls evaluation completed    FALL RISK PREVENTION PERTAINING TO THE HOME: Home free of loose throw rugs in walkways, pet beds, electrical cords, etc? Yes  Adequate lighting in your home to reduce risk of falls? Yes   ASSISTIVE DEVICES UTILIZED TO PREVENT FALLS: Life alert? No  Use of a cane, walker or w/c? No   TIMED UP AND GO: Was the test  performed? No .   Cognitive Function: Patient is alert and oriented x3.     10/22/2019    2:55 PM 05/04/2015    1:45 PM  MMSE - Mini Mental State Exam  Not completed: Unable to complete   Orientation to time  5  Orientation to Place  5  Registration  3  Attention/ Calculation  5  Recall  3  Language- name 2 objects  2  Language- repeat  1  Language- follow 3 step command  3  Language- read & follow direction  1  Write a sentence  1  Copy design  1  Total score  30        Immunizations Immunization History  Administered Date(s) Administered   DTaP 10/17/2008   Fluad Quad(high Dose 65+) 03/30/2019   Influenza, High Dose Seasonal PF 04/28/2017   Influenza-Unspecified 04/18/2016, 04/15/2017, 04/14/2020   PFIZER(Purple Top)SARS-COV-2 Vaccination 08/10/2019, 08/31/2019, 05/01/2020   Pneumococcal Conjugate-13 03/24/2014   Pneumococcal Polysaccharide-23 12/23/2006   Zoster, Live 12/23/2006   TDAP status: Due, Education has been provided regarding the importance of this vaccine. Advised may receive this vaccine at local pharmacy or Health Dept. Aware to provide a copy of the vaccination record if obtained from local pharmacy or Health Dept. Verbalized acceptance and understanding.  Shingrix Completed?: No.    Education has been provided regarding the importance of this vaccine. Patient has been advised to call insurance company to determine out of pocket expense if they have not yet received this vaccine. Advised may also receive vaccine at local pharmacy or Health Dept. Verbalized acceptance and understanding.  Screening Tests Health Maintenance  Topic Date Due   COVID-19 Vaccine (4 - Booster for Pfizer series) 11/14/2021 (Originally 06/26/2020)  Zoster Vaccines- Shingrix (1 of 2) 01/28/2022 (Originally 04/30/1988)   TETANUS/TDAP  10/30/2022 (Originally 10/14/2016)   INFLUENZA VACCINE  02/12/2022   Pneumonia Vaccine 3+ Years old  Completed   DEXA SCAN  Completed   HPV  VACCINES  Aged Out   Health Maintenance There are no preventive care reminders to display for this patient.  Lung Cancer Screening: (Low Dose CT Chest recommended if Age 69-80 years, 30 pack-year currently smoking OR have quit w/in 15years.) does not qualify.   Hepatitis C Screening: does not qualify  Vision Screening: Recommended annual ophthalmology exams for early detection of glaucoma and other disorders of the eye.  Dental Screening: Recommended annual dental exams for proper oral hygiene  Community Resource Referral / Chronic Care Management: CRR required this visit?  No   CCM required this visit?  No      Plan:   Keep all routine maintenance appointments.   I have personally reviewed and noted the following in the patient's chart:   Medical and social history Use of alcohol, tobacco or illicit drugs  Current medications and supplements including opioid prescriptions.  Functional ability and status Nutritional status Physical activity Advanced directives List of other physicians Hospitalizations, surgeries, and ER visits in previous 12 months Vitals Screenings to include cognitive, depression, and falls Referrals and appointments  In addition, I have reviewed and discussed with patient certain preventive protocols, quality metrics, and best practice recommendations. A written personalized care plan for preventive services as well as general preventive health recommendations were provided to patient.     Ashok Pall, LPN   4/54/0981

## 2021-11-07 ENCOUNTER — Telehealth: Payer: Self-pay | Admitting: Family

## 2021-11-07 ENCOUNTER — Encounter: Payer: Self-pay | Admitting: Family

## 2021-11-07 ENCOUNTER — Ambulatory Visit (INDEPENDENT_AMBULATORY_CARE_PROVIDER_SITE_OTHER): Payer: Medicare Other | Admitting: Family

## 2021-11-07 VITALS — BP 128/78 | HR 82 | Temp 98.0°F | Ht 59.0 in | Wt 118.8 lb

## 2021-11-07 DIAGNOSIS — M810 Age-related osteoporosis without current pathological fracture: Secondary | ICD-10-CM

## 2021-11-07 NOTE — Telephone Encounter (Signed)
Pt would like to move from prolia at endocrine to our office ? ?Last prolia 12/29 /22 ? ?How can we arrange? ?

## 2021-11-07 NOTE — Assessment & Plan Note (Addendum)
Tolerating medication.  ?No h/o CKD. Pending updated vitamin d,calcium. Discussed the risks of Prolia including the risk of vertebral, hip nonvertebral fractures.  We also discussed the risk of potential skin events including rash, eczema as well as recurrence of infection, some serious. Other potential risks were conveyed including risk of osteonecrosis of the jaw. We also discussed the risk of risk of rapid bone loss and fracture if discontinued or missed/delayed dose, which necessitates not missing/delaying or discontinuing without a plan in place.  ? ?We will seek to approve prolia through insurance and start to administer at our office as due 01/10/22. Offered repeat DEXA in 02/2022. She declines and would prefer monitor every 2 years. ?Repeat dexa 02/2023. As no data for prolia for 10 years and beyond regarding duration, based on BMD in 2024, we will discuss changing to bisphosphonate or alternative  ?

## 2021-11-07 NOTE — Progress Notes (Signed)
? ?Subjective:  ? ? Patient ID: Janet Shields, female    DOB: Sep 06, 1935, 86 y.o.   MRN: [id number] ? ?CC: Janet Shields is a 86 y.o. female who presents today for follow up.  ? ?HPI: Feels well today ?No new complaints ? ? ?Osteoporosis- she has been receiving prolia from endocrine, Dr Gabriel Carina, given 07/12/21 ? ?She tried fosamax in the past, taken approx 6 years. Evista taken 9 months 2011/2021.  ?Prolia started in 2013 ? ?Compliant with vitamin D and calcium ?No planned dental work besides cleaning. No jaw pain. ? ?No h/o fracture. ? ?No h/o CKD ? ?Walking for exercise 3 miles per day ? ?02/14/2021 DEXA interval increase BMD from 02-01-2019 ? ? ?HISTORY:  ?Past Medical History:  ?Diagnosis Date  ? History of colon polyps   ? Hyperlipidemia   ? Osteoporosis   ? ?Past Surgical History:  ?Procedure Laterality Date  ? BREAST BIOPSY  2007  ? CORE W/CLIP - NEG  ? ?Family History  ?Problem Relation Age of Onset  ? Arthritis Mother   ? Alcohol abuse Father   ? Heart disease Father 32  ?     CAD - died  ? Hyperlipidemia Brother   ? Diabetes Brother   ? Hypertension Brother   ? Diabetes Brother   ? Hyperlipidemia Brother   ? CAD Brother   ?     quadruple bypass  ? Diabetes Brother   ? Breast cancer Neg Hx   ? ? ?Allergies: Tramadol ?Current Outpatient Medications on File Prior to Visit  ?Medication Sig Dispense Refill  ? Calcium Citrate-Vitamin D (CAL-CITRATE PLUS VITAMIN D PO) Take 1 tablet by mouth daily.    ? denosumab (PROLIA) 60 MG/ML SOLN injection Inject 60 mg into the skin every 6 (six) months. Administer in upper arm, thigh, or abdomen    ? estradiol (ESTRACE) 0.1 MG/GM vaginal cream Estrogen Cream Instruction ? ?Discard applicator ? ?Apply pea sized amount to tip of finger to urethra before bed. Wash hands well after application. ?Use Monday, Wednesday and Friday 42.5 g 12  ? Multiple Vitamin (MULTIVITAMIN) tablet Take 1 tablet by mouth daily.    ? simvastatin (ZOCOR) 20 MG tablet TAKE 1 TABLET(20 MG) BY MOUTH  DAILY AT 6 PM 30 tablet 5  ? ?No current facility-administered medications on file prior to visit.  ? ? ?Social History  ? ?Tobacco Use  ? Smoking status: Never  ? Smokeless tobacco: Never  ?Vaping Use  ? Vaping Use: Never used  ?Substance Use Topics  ? Alcohol use: Yes  ?  Comment: Occasional wine with dinner  ? Drug use: No  ? ? ?Review of Systems  ?Constitutional:  Negative for chills and fever.  ?Respiratory:  Negative for cough.   ?Cardiovascular:  Negative for chest pain and palpitations.  ?Gastrointestinal:  Negative for nausea and vomiting.  ?   ?Objective:  ?  ?BP 128/78 (BP Location: Left Arm, Patient Position: Sitting, Cuff Size: Small)   Pulse 82   Temp 98 ?F (36.7 ?C) (Oral)   Ht 4\' 11"  (1.499 m)   Wt 118 lb 12.8 oz (53.9 kg)   SpO2 97%   BMI 23.99 kg/m?  ?BP Readings from Last 3 Encounters:  ?11/07/21 128/78  ?01/19/21 128/62  ?12/14/20 130/73  ? ?Wt Readings from Last 3 Encounters:  ?11/07/21 118 lb 12.8 oz (53.9 kg)  ?10/29/21 115 lb (52.2 kg)  ?01/19/21 115 lb (52.2 kg)  ? ? ?Physical Exam ?Vitals reviewed.  ?Constitutional:   ?  Appearance: She is well-developed.  ?Eyes:  ?   Conjunctiva/sclera: Conjunctivae normal.  ?Cardiovascular:  ?   Rate and Rhythm: Normal rate and regular rhythm.  ?   Pulses: Normal pulses.  ?   Heart sounds: Normal heart sounds.  ?Pulmonary:  ?   Effort: Pulmonary effort is normal.  ?   Breath sounds: Normal breath sounds. No wheezing, rhonchi or rales.  ?Skin: ?   General: Skin is warm and dry.  ?Neurological:  ?   Mental Status: She is alert.  ?Psychiatric:     ?   Speech: Speech normal.     ?   Behavior: Behavior normal.     ?   Thought Content: Thought content normal.  ? ? ?   ?Assessment & Plan:  ? ?Problem List Items Addressed This Visit   ? ?  ? Musculoskeletal and Integument  ? Osteoporosis - Primary  ?  Tolerating medication.  ?No h/o CKD. Pending updated vitamin d,calcium. Discussed the risks of Prolia including the risk of vertebral, hip nonvertebral  fractures.  We also discussed the risk of potential skin events including rash, eczema as well as recurrence of infection, some serious. Other potential risks were conveyed including risk of osteonecrosis of the jaw. We also discussed the risk of risk of rapid bone loss and fracture if discontinued or missed/delayed dose, which necessitates not missing/delaying or discontinuing without a plan in place.  ? ?We will seek to approve prolia through insurance and start to administer at our office as due 01/10/22.  ?Repeat dexa 02/2023. As no data for prolia for 10 years and beyond regarding duration, based on BMD in 2024, we will discuss changing to bisphosphonate or alternative  ?  ?  ? Relevant Orders  ? Lipid panel  ? Comprehensive metabolic panel  ? CBC with Differential/Platelet  ? VITAMIN D 25 Hydroxy (Vit-D Deficiency, Fractures)  ? Hemoglobin A1c  ? ? ? ?I am having Markham Jordan maintain her denosumab, multivitamin, Calcium Citrate-Vitamin D (CAL-CITRATE PLUS VITAMIN D PO), estradiol, and simvastatin. ? ? ?No orders of the defined types were placed in this encounter. ? ? ?Return precautions given.  ? ?Risks, benefits, and alternatives of the medications and treatment plan prescribed today were discussed, and patient expressed understanding.  ? ?Education regarding symptom management and diagnosis given to patient on AVS. ? ?Continue to follow with Burnard Hawthorne, FNP for routine health maintenance.  ? ?Markham Jordan and I agreed with plan.  ? ?Mable Paris, FNP ? ? ?

## 2021-11-07 NOTE — Patient Instructions (Addendum)
I have sent a message to our lead nurse regarding Prolia resuming here ?If you are not called in the next 1-2 weeks to schedule this as due 01/10/22, please call me to let me know.  ? ?We will also work to get Prolia approved through insurance.  ? ?We discussed the risk Prolia including the risk of vertebral, hip nonvertebral fractures.  Prolia also has the risk of potential skin events including rash, eczema as well as recurrence of infection, some serious. Other potential risks  include risk of osteonecrosis of the jaw. Please let me know of ANY planned dental procedure for this reason.  If you decide to come off this medication, there is risk of rapid bone loss and fracture, therefore prior to discontinuing or missing a dose, please let me know so we can make a plan for another agent.  ? ?We want you to optimize vitamin d and calcium. Please follow the below doses.  ? ?For post menopausal women, guidelines recommend a diet with 1200 mg of Calcium per day. If you are eating calcium rich foods, you do not need a calcium supplement. The body better absorbs the calcium that you eat over supplementation. If you do supplement, I recommend not supplementing the full 1200 mg/ day as this can lead to increased risk of cardiovascular disease. I recommend Calcium Citrate over the counter, and you may take a total of 600 to 800 mg per day in divided doses with meals for best absorption.  ? ?For bone health, you need adequate vitamin D, and I recommend you supplement as it is harder to do so with diet alone. I recommend cholecalciferol 800 units daily.  Also, please ensure you are following a diet high in calcium -- research shows better outcomes with dietary sources including kale, yogurt, broccolii, cheese, okra, almonds- to name a few.   ? ?Also remember that exercise is a great medicine for maintain and preserve bone health. Advise moderate exercise for 30 minutes , 3 times per week.   ? ? ?Denosumab injection ?What is  this medicine? ?DENOSUMAB (den oh sue mab) slows bone breakdown. Prolia is used to treat osteoporosis in women after menopause and in men, and in people who are taking corticosteroids for 6 months or more. Delton See is used to treat a high calcium level due to cancer and to prevent bone fractures and other bone problems caused by multiple myeloma or cancer bone metastases. Delton See is also used to treat giant cell tumor of the bone. ?This medicine may be used for other purposes; ask your health care provider or pharmacist if you have questions. ?COMMON BRAND NAME(S): Prolia, XGEVA ?What should I tell my health care provider before I take this medicine? ?They need to know if you have any of these conditions: ?dental disease ?having surgery or tooth extraction ?infection ?kidney disease ?low levels of calcium or Vitamin D in the blood ?malnutrition ?on hemodialysis ?skin conditions or sensitivity ?thyroid or parathyroid disease ?an unusual reaction to denosumab, other medicines, foods, dyes, or preservatives ?pregnant or trying to get pregnant ?breast-feeding ?How should I use this medicine? ?This medicine is for injection under the skin. It is given by a health care professional in a hospital or clinic setting. ?A special MedGuide will be given to you before each treatment. Be sure to read this information carefully each time. ?For Prolia, talk to your pediatrician regarding the use of this medicine in children. Special care may be needed. For Delton See, talk to your pediatrician  regarding the use of this medicine in children. While this drug may be prescribed for children as young as 13 years for selected conditions, precautions do apply. ?Overdosage: If you think you have taken too much of this medicine contact a poison control center or emergency room at once. ?NOTE: This medicine is only for you. Do not share this medicine with others. ?What if I miss a dose? ?It is important not to miss your dose. Call your doctor or  health care professional if you are unable to keep an appointment. ?What may interact with this medicine? ?Do not take this medicine with any of the following medications: ?other medicines containing denosumab ?This medicine may also interact with the following medications: ?medicines that lower your chance of fighting infection ?steroid medicines like prednisone or cortisone ?This list may not describe all possible interactions. Give your health care provider a list of all the medicines, herbs, non-prescription drugs, or dietary supplements you use. Also tell them if you smoke, drink alcohol, or use illegal drugs. Some items may interact with your medicine. ?What should I watch for while using this medicine? ?Visit your doctor or health care professional for regular checks on your progress. Your doctor or health care professional may order blood tests and other tests to see how you are doing. ?Call your doctor or health care professional for advice if you get a fever, chills or sore throat, or other symptoms of a cold or flu. Do not treat yourself. This drug may decrease your body's ability to fight infection. Try to avoid being around people who are sick. ?You should make sure you get enough calcium and vitamin D while you are taking this medicine, unless your doctor tells you not to. Discuss the foods you eat and the vitamins you take with your health care professional. ?See your dentist regularly. Brush and floss your teeth as directed. Before you have any dental work done, tell your dentist you are receiving this medicine. ?Do not become pregnant while taking this medicine or for 5 months after stopping it. Talk with your doctor or health care professional about your birth control options while taking this medicine. Women should inform their doctor if they wish to become pregnant or think they might be pregnant. There is a potential for serious side effects to an unborn child. Talk to your health care  professional or pharmacist for more information. ?What side effects may I notice from receiving this medicine? ?Side effects that you should report to your doctor or health care professional as soon as possible: ?allergic reactions like skin rash, itching or hives, swelling of the face, lips, or tongue ?bone pain ?breathing problems ?dizziness ?jaw pain, especially after dental work ?redness, blistering, peeling of the skin ?signs and symptoms of infection like fever or chills; cough; sore throat; pain or trouble passing urine ?signs of low calcium like fast heartbeat, muscle cramps or muscle pain; pain, tingling, numbness in the hands or feet; seizures ?unusual bleeding or bruising ?unusually weak or tired ?Side effects that usually do not require medical attention (report to your doctor or health care professional if they continue or are bothersome): ?constipation ?diarrhea ?headache ?joint pain ?loss of appetite ?muscle pain ?runny nose ?tiredness ?upset stomach ?This list may not describe all possible side effects. Call your doctor for medical advice about side effects. You may report side effects to FDA at 1-800-FDA-1088. ?Where should I keep my medicine? ?This medicine is only given in a clinic, doctor's office, or other health  care setting and will not be stored at home. ?NOTE: This sheet is a summary. It may not cover all possible information. If you have questions about this medicine, talk to your doctor, pharmacist, or health care provider. ?? 2020 Elsevier/Gold Standard (2017-11-07 16:10:44) ? ?

## 2021-11-08 NOTE — Telephone Encounter (Signed)
First we need [employer] approval and then we will proceed with authorization through patient insurance. I filed for [employer] ?

## 2021-11-09 ENCOUNTER — Telehealth: Payer: Self-pay | Admitting: Family

## 2021-11-09 NOTE — Telephone Encounter (Signed)
noted 

## 2021-11-09 NOTE — Telephone Encounter (Signed)
Janet Shields, prolia approved ?I have insurance document if needed ?Please let me know when scheduled ? ?

## 2021-11-12 NOTE — Telephone Encounter (Signed)
Patient scheduled for 01/11/2022 Prolia injection.  ?

## 2021-11-12 NOTE — Telephone Encounter (Signed)
Form  given to you today ?Let me know if you need anything else ?

## 2021-11-12 NOTE — Telephone Encounter (Signed)
Can schedule patient once we have calcium level from Labs scheduled 12/05/21 ?

## 2021-11-12 NOTE — Telephone Encounter (Signed)
I need the insurance document and has to be filed from this office for approval cannot be an approval for another office so yes I need to see, ?

## 2021-11-27 ENCOUNTER — Encounter: Payer: Self-pay | Admitting: *Deleted

## 2021-12-05 ENCOUNTER — Other Ambulatory Visit (INDEPENDENT_AMBULATORY_CARE_PROVIDER_SITE_OTHER): Payer: Medicare Other

## 2021-12-05 DIAGNOSIS — M810 Age-related osteoporosis without current pathological fracture: Secondary | ICD-10-CM

## 2021-12-05 LAB — CBC WITH DIFFERENTIAL/PLATELET
Basophils Absolute: 0 10*3/uL (ref 0.0–0.1)
Basophils Relative: 0.7 % (ref 0.0–3.0)
Eosinophils Absolute: 0.1 10*3/uL (ref 0.0–0.7)
Eosinophils Relative: 2.9 % (ref 0.0–5.0)
HCT: 41.3 % (ref 36.0–46.0)
Hemoglobin: 14 g/dL (ref 12.0–15.0)
Lymphocytes Relative: 31.2 % (ref 12.0–46.0)
Lymphs Abs: 1 10*3/uL (ref 0.7–4.0)
MCHC: 33.8 g/dL (ref 30.0–36.0)
MCV: 90.7 fl (ref 78.0–100.0)
Monocytes Absolute: 0.4 10*3/uL (ref 0.1–1.0)
Monocytes Relative: 12.8 % — ABNORMAL HIGH (ref 3.0–12.0)
Neutro Abs: 1.6 10*3/uL (ref 1.4–7.7)
Neutrophils Relative %: 52.4 % (ref 43.0–77.0)
Platelets: 242 10*3/uL (ref 150.0–400.0)
RBC: 4.55 Mil/uL (ref 3.87–5.11)
RDW: 13.1 % (ref 11.5–15.5)
WBC: 3.1 10*3/uL — ABNORMAL LOW (ref 4.0–10.5)

## 2021-12-05 LAB — LIPID PANEL
Cholesterol: 178 mg/dL (ref 0–200)
HDL: 69.2 mg/dL (ref >39.00)
LDL Cholesterol: 96 mg/dL (ref 0–99)
NonHDL: 108.37
Total CHOL/HDL Ratio: 3
Triglycerides: 63 mg/dL (ref 0.0–149.0)
VLDL: 12.6 mg/dL (ref 0.0–40.0)

## 2021-12-05 LAB — COMPREHENSIVE METABOLIC PANEL
ALT: 20 U/L (ref 0–35)
AST: 23 U/L (ref 0–37)
Albumin: 4.3 g/dL (ref 3.5–5.2)
Alkaline Phosphatase: 29 U/L — ABNORMAL LOW (ref 39–117)
BUN: 12 mg/dL (ref 6–23)
CO2: 29 mEq/L (ref 19–32)
Calcium: 9.3 mg/dL (ref 8.4–10.5)
Chloride: 101 mEq/L (ref 96–112)
Creatinine, Ser: 0.62 mg/dL (ref 0.40–1.20)
GFR: 82.25 mL/min (ref >60.00)
Glucose, Bld: 100 mg/dL — ABNORMAL HIGH (ref 70–99)
Potassium: 3.7 mEq/L (ref 3.5–5.1)
Sodium: 138 mEq/L (ref 135–145)
Total Bilirubin: 0.6 mg/dL (ref 0.2–1.2)
Total Protein: 7.1 g/dL (ref 6.0–8.3)

## 2021-12-05 LAB — VITAMIN D 25 HYDROXY (VIT D DEFICIENCY, FRACTURES): VITD: 54.56 ng/mL (ref 30.00–100.00)

## 2021-12-05 LAB — HEMOGLOBIN A1C: Hgb A1c MFr Bld: 6 % (ref 4.6–6.5)

## 2021-12-11 ENCOUNTER — Other Ambulatory Visit: Payer: Self-pay

## 2021-12-11 ENCOUNTER — Telehealth: Payer: Self-pay

## 2021-12-11 DIAGNOSIS — R899 Unspecified abnormal finding in specimens from other organs, systems and tissues: Secondary | ICD-10-CM

## 2021-12-11 NOTE — Telephone Encounter (Signed)
LMTCB for lab results. Pt needs CBC in 6-8 weeks & I have ordered.

## 2021-12-12 NOTE — Telephone Encounter (Signed)
Pt aware of $296 due at time of Prolia injection on 01/11/22

## 2021-12-12 NOTE — Telephone Encounter (Signed)
PA Pending for our office

## 2021-12-13 NOTE — Telephone Encounter (Signed)
Pt informed of $296 due on the day of her Prolia injection at lab visit on 12/05/21.   PA approved: ref# [id number] (will scan PA to chart)

## 2022-01-11 ENCOUNTER — Other Ambulatory Visit: Payer: Self-pay | Admitting: Family

## 2022-01-11 ENCOUNTER — Ambulatory Visit (INDEPENDENT_AMBULATORY_CARE_PROVIDER_SITE_OTHER): Payer: Medicare Other

## 2022-01-11 DIAGNOSIS — M810 Age-related osteoporosis without current pathological fracture: Secondary | ICD-10-CM | POA: Diagnosis not present

## 2022-01-11 DIAGNOSIS — E785 Hyperlipidemia, unspecified: Secondary | ICD-10-CM

## 2022-01-11 MED ORDER — DENOSUMAB 60 MG/ML ~~LOC~~ SOSY
60.0000 mg | PREFILLED_SYRINGE | Freq: Once | SUBCUTANEOUS | Status: AC
  Administered 2022-01-11: 60 mg via SUBCUTANEOUS

## 2022-01-11 NOTE — Progress Notes (Signed)
Patient came in today for B-12 injection given in left arm SQ. Patient tolerated well with no signs of distress.

## 2022-03-13 ENCOUNTER — Encounter: Payer: Self-pay | Admitting: Family Medicine

## 2022-03-13 ENCOUNTER — Ambulatory Visit (INDEPENDENT_AMBULATORY_CARE_PROVIDER_SITE_OTHER): Payer: Medicare Other | Admitting: Family Medicine

## 2022-03-13 VITALS — BP 110/70 | HR 75 | Temp 98.7°F | Ht 59.0 in | Wt 119.2 lb

## 2022-03-13 DIAGNOSIS — T63301A Toxic effect of unspecified spider venom, accidental (unintentional), initial encounter: Secondary | ICD-10-CM | POA: Diagnosis not present

## 2022-03-13 DIAGNOSIS — L03313 Cellulitis of chest wall: Secondary | ICD-10-CM | POA: Diagnosis not present

## 2022-03-13 DIAGNOSIS — Z23 Encounter for immunization: Secondary | ICD-10-CM | POA: Diagnosis not present

## 2022-03-13 MED ORDER — CEFADROXIL 500 MG PO CAPS: 500.0000 mg | ORAL_CAPSULE | Freq: Two times a day (BID) | ORAL | 0 refills | Status: DC

## 2022-03-13 NOTE — Assessment & Plan Note (Signed)
Patient with likely spider bite and now concern for possible cellulitis from this given spreading redness.  We will start the patient on cefadroxil 500 mg twice daily for 7 days.  If she has any additional spreading redness while on antibiotics or she develops any systemic symptoms she will seek medical attention immediately.  Td was given today given that she was out of date and had a spider bite.

## 2022-03-13 NOTE — Progress Notes (Signed)
  Janet Alar, MD Phone: [phone]  Janet Shields is a 86 y.o. female who presents today for same-day visit.  Spider bite: Patient reports a presumed spider bite that occurred on Monday.  Started out as a pinpoint area of redness and since then has been having spreading redness.  There is been some itching.  No significant pain.  No drainage.  This occurred on her inferior inner quadrant of her left breast  Social History   Tobacco Use  Smoking Status Never  Smokeless Tobacco Never    Current Outpatient Medications on File Prior to Visit  Medication Sig Dispense Refill   Calcium Citrate-Vitamin D (CAL-CITRATE PLUS VITAMIN D PO) Take 1 tablet by mouth daily.     denosumab (PROLIA) 60 MG/ML SOLN injection Inject 60 mg into the skin every 6 (six) months. Administer in upper arm, thigh, or abdomen     estradiol (ESTRACE) 0.1 MG/GM vaginal cream Estrogen Cream Instruction  Discard applicator  Apply pea sized amount to tip of finger to urethra before bed. Wash hands well after application. Use Monday, Wednesday and Friday 42.5 g 12   latanoprost (XALATAN) 0.005 % ophthalmic solution 1 drop at bedtime.     Multiple Vitamin (MULTIVITAMIN) tablet Take 1 tablet by mouth daily.     simvastatin (ZOCOR) 20 MG tablet TAKE 1 TABLET(20 MG) BY MOUTH DAILY AT 6 PM 30 tablet 5   No current facility-administered medications on file prior to visit.     ROS see history of present illness  Objective  Physical Exam Vitals:   03/13/22 1340  BP: 110/70  Pulse: 75  Temp: 98.7 F (37.1 C)  SpO2: 97%    BP Readings from Last 3 Encounters:  03/13/22 110/70  11/07/21 128/78  01/19/21 128/62   Wt Readings from Last 3 Encounters:  03/13/22 119 lb 3.2 oz (54.1 kg)  11/07/21 118 lb 12.8 oz (53.9 kg)  10/29/21 115 lb (52.2 kg)    Physical Exam Chest:       Comments: Charlyne Mom, CMA served as chaperone     Assessment/Plan: Please see individual problem list.  Problem List  Items Addressed This Visit     Cellulitis   Relevant Medications   cefadroxil (DURICEF) 500 MG capsule   Spider bite - Primary    Patient with likely spider bite and now concern for possible cellulitis from this given spreading redness.  We will start the patient on cefadroxil 500 mg twice daily for 7 days.  If she has any additional spreading redness while on antibiotics or she develops any systemic symptoms she will seek medical attention immediately.  Td was given today given that she was out of date and had a spider bite.      Relevant Orders   Td : Tetanus/diphtheria >7yo Preservative  free (Completed)    Return if symptoms worsen or fail to improve.   Janet Alar, MD [facility]

## 2022-03-13 NOTE — Patient Instructions (Signed)
Nice to see you. Please complete the course of antibiotics.  If you have any spreading redness or develop any fever or chills or other new symptoms please seek medical attention immediately.

## 2022-04-09 ENCOUNTER — Encounter: Payer: Self-pay | Admitting: Ophthalmology

## 2022-04-11 ENCOUNTER — Other Ambulatory Visit: Payer: Self-pay | Admitting: Family

## 2022-04-11 DIAGNOSIS — Z1231 Encounter for screening mammogram for malignant neoplasm of breast: Secondary | ICD-10-CM

## 2022-04-16 NOTE — Discharge Instructions (Signed)

## 2022-04-17 ENCOUNTER — Ambulatory Visit: Payer: Medicare Other | Admitting: General Practice

## 2022-04-17 ENCOUNTER — Encounter: Payer: Self-pay | Admitting: Ophthalmology

## 2022-04-17 ENCOUNTER — Other Ambulatory Visit: Payer: Self-pay

## 2022-04-17 ENCOUNTER — Encounter
Admission: RE | Disposition: A | Discharge status: Home / Self Care | Payer: Self-pay | Source: Home / Self Care | Attending: Ophthalmology

## 2022-04-17 ENCOUNTER — Ambulatory Visit
Admission: RE | Admit: 2022-04-17 | Discharge: 2022-04-17 | Disposition: A | Discharge status: Home / Self Care | Payer: Medicare Other | Attending: Ophthalmology | Admitting: Ophthalmology

## 2022-04-17 DIAGNOSIS — H2511 Age-related nuclear cataract, right eye: Secondary | ICD-10-CM | POA: Insufficient documentation

## 2022-04-17 HISTORY — PX: CATARACT EXTRACTION W/PHACO: SHX586

## 2022-04-17 SURGERY — PHACOEMULSIFICATION, CATARACT, WITH IOL INSERTION
Anesthesia: Monitor Anesthesia Care | Site: Eye | Laterality: Right

## 2022-04-17 MED ORDER — SIGHTPATH DOSE#1 BSS IO SOLN
INTRAOCULAR | Status: DC | PRN
  Administered 2022-04-17: 15 mL via INTRAOCULAR

## 2022-04-17 MED ORDER — TETRACAINE HCL 0.5 % OP SOLN
1.0000 [drp] | OPHTHALMIC | Status: DC | PRN
  Administered 2022-04-17 (×2): 1 [drp] via OPHTHALMIC

## 2022-04-17 MED ORDER — ACETAMINOPHEN 160 MG/5ML PO SOLN: 325.0000 mg | ORAL | Status: DC | PRN

## 2022-04-17 MED ORDER — SIGHTPATH DOSE#1 BSS IO SOLN
INTRAOCULAR | Status: DC | PRN
  Administered 2022-04-17: 78 mL via OPHTHALMIC

## 2022-04-17 MED ORDER — ONDANSETRON HCL 4 MG/2ML IJ SOLN: 4.0000 mg | Freq: Once | INTRAMUSCULAR | Status: DC | PRN

## 2022-04-17 MED ORDER — BRIMONIDINE TARTRATE-TIMOLOL 0.2-0.5 % OP SOLN
OPHTHALMIC | Status: DC | PRN
  Administered 2022-04-17: 1 [drp] via OPHTHALMIC

## 2022-04-17 MED ORDER — SIGHTPATH DOSE#1 NA HYALUR & NA CHOND-NA HYALUR IO KIT
PACK | INTRAOCULAR | Status: DC | PRN
  Administered 2022-04-17: 1 via OPHTHALMIC

## 2022-04-17 MED ORDER — CEFUROXIME OPHTHALMIC INJECTION 1 MG/0.1 ML
INJECTION | OPHTHALMIC | Status: DC | PRN
  Administered 2022-04-17: 0.1 mL via INTRACAMERAL

## 2022-04-17 MED ORDER — FENTANYL CITRATE (PF) 100 MCG/2ML IJ SOLN
INTRAMUSCULAR | Status: DC | PRN
  Administered 2022-04-17: 50 ug via INTRAVENOUS

## 2022-04-17 MED ORDER — MIDAZOLAM HCL 2 MG/2ML IJ SOLN
INTRAMUSCULAR | Status: DC | PRN
  Administered 2022-04-17: 1 mg via INTRAVENOUS

## 2022-04-17 MED ORDER — LACTATED RINGERS IV SOLN: INTRAVENOUS | Status: DC

## 2022-04-17 MED ORDER — ARMC OPHTHALMIC DILATING DROPS
1.0000 | OPHTHALMIC | Status: DC | PRN
  Administered 2022-04-17 (×3): 1 via OPHTHALMIC

## 2022-04-17 MED ORDER — ACETAMINOPHEN 325 MG PO TABS: 650.0000 mg | ORAL_TABLET | Freq: Once | ORAL | Status: DC | PRN

## 2022-04-17 MED ORDER — SIGHTPATH DOSE#1 BSS IO SOLN
INTRAOCULAR | Status: DC | PRN
  Administered 2022-04-17: 2 mL

## 2022-04-17 SURGICAL SUPPLY — 13 items
CANNULA ANT/CHMB 27G (MISCELLANEOUS) IMPLANT
CANNULA ANT/CHMB 27GA (MISCELLANEOUS) IMPLANT
CATARACT SUITE SIGHTPATH (MISCELLANEOUS) ×1 IMPLANT
FEE CATARACT SUITE SIGHTPATH (MISCELLANEOUS) ×1 IMPLANT
GLOVE SRG 8 PF TXTR STRL LF DI (GLOVE) ×1 IMPLANT
GLOVE SURG ENC TEXT LTX SZ7.5 (GLOVE) ×1 IMPLANT
GLOVE SURG UNDER POLY LF SZ8 (GLOVE) ×1
LENS CLARION VIVITY TORIC ×1 IMPLANT
LENS IOL CLRN TRC 5 18.0 IMPLANT
NDL FILTER BLUNT 18X1 1/2 (NEEDLE) ×1 IMPLANT
NEEDLE FILTER BLUNT 18X1 1/2 (NEEDLE) ×1 IMPLANT
SYR 3ML LL SCALE MARK (SYRINGE) ×1 IMPLANT
WATER STERILE IRR 250ML POUR (IV SOLUTION) ×1 IMPLANT

## 2022-04-17 NOTE — H&P (Signed)
[facility]   Primary Care Physician:  Allegra Grana, FNP Ophthalmologist: Dr. Lockie Mola  Pre-Procedure History & Physical: HPI:  Janet Shields is a 86 y.o. female here for ophthalmic surgery.   Past Medical History:  Diagnosis Date   History of colon polyps    Hyperlipidemia    Osteoporosis    Osteoporosis     Past Surgical History:  Procedure Laterality Date   BREAST BIOPSY  2007   CORE W/CLIP - NEG   CATARACT EXTRACTION W/ INTRAOCULAR LENS IMPLANT Left 07/2018    Prior to Admission medications   Medication Sig Start Date End Date Taking? Authorizing Provider  Biotin 5000 MCG TABS Take by mouth daily.   Yes [provider]  Calcium Citrate-Vitamin D (CAL-CITRATE PLUS VITAMIN D PO) Take 1 tablet by mouth daily.   Yes [provider]  CRANBERRY-VITAMIN C PO Take by mouth daily.   Yes [provider]  denosumab (PROLIA) 60 MG/ML SOLN injection Inject 60 mg into the skin every 6 (six) months. Administer in upper arm, thigh, or abdomen   Yes [provider]  latanoprost (XALATAN) 0.005 % ophthalmic solution 1 drop at bedtime. 02/24/22  Yes [provider]  Multiple Vitamin (MULTIVITAMIN) tablet Take 1 tablet by mouth daily.   Yes [provider]  saccharomyces boulardii (FLORASTOR) 250 MG capsule Take 250 mg by mouth daily.   Yes [provider]  simvastatin (ZOCOR) 20 MG tablet TAKE 1 TABLET(20 MG) BY MOUTH DAILY AT 6 PM 01/11/22  Yes Arnett, Lyn Records, FNP  estradiol (ESTRACE) 0.1 MG/GM vaginal cream Estrogen Cream Instruction  Discard applicator  Apply pea sized amount to tip of finger to urethra before bed. Wash hands well after application. Use Monday, Wednesday and Friday Patient not taking: Reported on 04/09/2022 12/18/20   [name], MD    Allergies as of 01/16/2022 - Review Complete 11/07/2021  Allergen Reaction Noted   Tramadol Nausea And Vomiting 11/10/2017    Family  History  Problem Relation Age of Onset   Arthritis Mother    Alcohol abuse Father    Heart disease Father 32       CAD - died   Hyperlipidemia Brother    Diabetes Brother    Hypertension Brother    Diabetes Brother    Hyperlipidemia Brother    CAD Brother        quadruple bypass   Diabetes Brother    Breast cancer Neg Hx     Social History   Socioeconomic History   Marital status: Single    Spouse name: Not on file   Number of children: 1   Years of education: 18   Highest education level: Not on file  Occupational History   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  Tobacco Use   Smoking status: Never   Smokeless tobacco: Never  Vaping Use   Vaping Use: Never used  Substance and Sexual Activity   Alcohol use: Yes    Comment: Occasional wine with dinner   Drug use: No   Sexual activity: Not Currently  Other Topics Concern   Not on file  Social History Narrative   Hobbies: flower gardening, read, travel   Caffeine: 4 cups of coffee   Exercise: walk 3 miles on treadmill 3 a week, resistance exercises [employer]   Social Determinants of Health   Financial Resource Strain: Low Risk  (10/29/2021)   Overall Financial Resource Strain (CARDIA)  Difficulty of Paying Living Expenses: Not hard at all  Food Insecurity: No Food Insecurity (10/29/2021)   Hunger Vital Sign    Worried About Running Out of Food in the Last Year: Never true    Ran Out of Food in the Last Year: Never true  Transportation Needs: No Transportation Needs (10/29/2021)   PRAPARE - Hydrologist (Medical): No    Lack of Transportation (Non-Medical): No  Physical Activity: Sufficiently Active (10/29/2021)   Exercise Vital Sign    Days of Exercise per Week: 5 days    Minutes of Exercise per Session: 60 min  Stress: No Stress Concern Present (10/29/2021)   [place]    Feeling of Stress : Only a little   Social Connections: Unknown (10/29/2021)   Social Connection and Isolation Panel [NHANES]    Frequency of Communication with Friends and Family: More than three times a week    Frequency of Social Gatherings with Friends and Family: More than three times a week    Attends Religious Services: More than 4 times per year    Active Member of [employer] or Organizations: Yes    Attends Archivist Meetings: More than 4 times per year    Marital Status: Not on file  Intimate Partner Violence: Not At Risk (10/29/2021)   Humiliation, Afraid, Rape, and Kick questionnaire    Fear of Current or Ex-Partner: No    Emotionally Abused: No    Physically Abused: No    Sexually Abused: No    Review of Systems: See HPI, otherwise negative ROS  Physical Exam: BP (!) 152/68   Temp (!) 97.5 F (36.4 C) (Tympanic)   Ht 4' 11.02" (1.499 m)   Wt 52.6 kg   SpO2 98%   BMI 23.40 kg/m  General:   Alert,  pleasant and cooperative in NAD Head:  Normocephalic and atraumatic. Lungs:  Clear to auscultation.    Heart:  Regular rate and rhythm.   Impression/Plan: [name] is here for ophthalmic surgery.  Risks, benefits, limitations, and alternatives regarding ophthalmic surgery have been reviewed with the patient.  Questions have been answered.  All parties agreeable.   Leandrew Koyanagi, MD  04/17/2022, 11:33 AM

## 2022-04-17 NOTE — Transfer of Care (Signed)
Immediate Anesthesia Transfer of Care Note  Patient: Janet Shields  Procedure(s) Performed: CATARACT EXTRACTION PHACO AND INTRAOCULAR LENS PLACEMENT (IOC) RIGHT  CLARION TORIC LENS 15.36 01:45.8 (Right: Eye)  Patient Location: PACU  Anesthesia Type: MAC  Level of Consciousness: awake, alert  and patient cooperative  Airway and Oxygen Therapy: Patient Spontanous Breathing and Patient connected to supplemental oxygen  Post-op Assessment: Post-op Vital signs reviewed, Patient's Cardiovascular Status Stable, Respiratory Function Stable, Patent Airway and No signs of Nausea or vomiting  Post-op Vital Signs: Reviewed and stable  Complications: There were no known notable events for this encounter.

## 2022-04-17 NOTE — Op Note (Signed)
LOCATION:  [place]   PREOPERATIVE DIAGNOSIS:  Nuclear sclerotic cataract of the right eye.  H25.11   POSTOPERATIVE DIAGNOSIS:  Nuclear sclerotic cataract of the right eye.   PROCEDURE:  Phacoemulsification with Toric posterior chamber intraocular lens placement of the right eye.  Ultrasound time: Procedure(s): CATARACT EXTRACTION PHACO AND INTRAOCULAR LENS PLACEMENT (IOC) RIGHT  CLARION TORIC LENS 15.36 01:45.8 (Right)  LENS:   Implant Name Type Inv. Item Serial No. Manufacturer Lot No. LRB No. Used Action  Clareon Toric 18.0 D Intraocular Lens  [id number] ALCON  Right 1 Implanted     [id number] 18.0 Toric intraocular lens with 3.0 diopters of cylindrical power with axis orientation at 23 degrees.   SURGEON:  Wyonia Hough, MD   ANESTHESIA: Topical with tetracaine drops and 2% Xylocaine jelly, augmented with 1% preservative-free intracameral lidocaine. .   COMPLICATIONS:  None.   DESCRIPTION OF PROCEDURE:  The patient was identified in the holding room and transported to the operating suite and placed in the supine position under the operating microscope.  The right eye was identified as the operative eye, and it was prepped and draped in the usual sterile ophthalmic fashion.    A clear-corneal paracentesis incision was made at the 12:00 position.  0.5 ml of preservative-free 1% lidocaine was injected into the anterior chamber. The anterior chamber was filled with Viscoat.  A 2.4 millimeter near clear corneal incision was then made at the 9:00 position.  A cystotome and capsulorrhexis forceps were then used to make a curvilinear capsulorrhexis.  Hydrodissection and hydrodelineation were then performed using balanced salt solution.   Phacoemulsification was then used in stop and chop fashion to remove the lens, nucleus and epinucleus.  The remaining cortex was aspirated using the irrigation and aspiration handpiece.  Provisc viscoelastic was then placed into the capsular  bag to distend it for lens placement.  The Verion digital marker was used to align the implant at the intended axis.   A Toric lens was then injected into the capsular bag.  It was rotated clockwise until the axis marks on the lens were approximately 15 degrees in the counterclockwise direction to the intended alignment.  The viscoelastic was aspirated from the eye using the irrigation aspiration handpiece.  Then, a Koch spatula through the sideport incision was used to rotate the lens in a clockwise direction until the axis markings of the intraocular lens were lined up with the Verion alignment.  Balanced salt solution was then used to hydrate the wounds. Cefuroxime 0.1 ml of a 10mg /ml solution was injected into the anterior chamber for a dose of 1 mg of intracameral antibiotic at the completion of the case.    The eye was noted to have a physiologic pressure and there was no wound leak noted.   Timolol and Brimonidine drops were applied to the eye.  The patient was taken to the recovery room in stable condition having had no complications of anesthesia or surgery.  Liboria Putnam 04/17/2022, 12:52 PM

## 2022-04-17 NOTE — Anesthesia Preprocedure Evaluation (Addendum)
Anesthesia Evaluation  Patient identified by MRN, date of birth, ID band Patient awake    Reviewed: Allergy & Precautions, NPO status , Patient's Chart, lab work & pertinent test results  History of Anesthesia Complications Negative for: history of anesthetic complications  Airway Mallampati: IV   Neck ROM: Full    Dental  (+) Missing   Pulmonary neg pulmonary ROS,    Pulmonary exam normal breath sounds clear to auscultation       Cardiovascular Exercise Tolerance: Good negative cardio ROS Normal cardiovascular exam Rhythm:Regular Rate:Normal     Neuro/Psych negative neurological ROS     GI/Hepatic negative GI ROS,   Endo/Other  negative endocrine ROS  Renal/GU negative Renal ROS     Musculoskeletal   Abdominal   Peds  Hematology negative hematology ROS (+)   Anesthesia Other Findings   Reproductive/Obstetrics                            Anesthesia Physical Anesthesia Plan  ASA: 1  Anesthesia Plan: MAC   Post-op Pain Management:    Induction: Intravenous  PONV Risk Score and Plan: 2 and Treatment may vary due to age or medical condition, Midazolam and TIVA  Airway Management Planned: Natural Airway and Nasal Cannula  Additional Equipment:   Intra-op Plan:   Post-operative Plan:   Informed Consent: I have reviewed the patients History and Physical, chart, labs and discussed the procedure including the risks, benefits and alternatives for the proposed anesthesia with the patient or authorized representative who has indicated his/her understanding and acceptance.     Dental advisory given  Plan Discussed with: CRNA  Anesthesia Plan Comments: (LMA/GETA backup discussed.  Patient consented for risks of anesthesia including but not limited to:  - adverse reactions to medications - damage to eyes, teeth, lips or other oral mucosa - nerve damage due to positioning  - sore  throat or hoarseness - damage to heart, brain, nerves, lungs, other parts of body or loss of life  Informed patient about role of CRNA in peri- and intra-operative care.  Patient voiced understanding.)        Anesthesia Quick Evaluation

## 2022-04-17 NOTE — Anesthesia Postprocedure Evaluation (Signed)
Anesthesia Post Note  Patient: Shakeela Rabadan  Procedure(s) Performed: CATARACT EXTRACTION PHACO AND INTRAOCULAR LENS PLACEMENT (IOC) RIGHT  CLARION TORIC LENS 15.36 01:45.8 (Right: Eye)  Patient location during evaluation: PACU Anesthesia Type: MAC Level of consciousness: awake and alert, oriented and patient cooperative Pain management: pain level controlled Vital Signs Assessment: post-procedure vital signs reviewed and stable Respiratory status: spontaneous breathing, nonlabored ventilation and respiratory function stable Cardiovascular status: blood pressure returned to baseline and stable Postop Assessment: adequate PO intake Anesthetic complications: no   There were no known notable events for this encounter.   Last Vitals:  Vitals:   04/17/22 1254 04/17/22 1300  BP: (!) 117/52 126/60  Pulse: 69 74  Resp: 17 13  Temp: (!) 36.1 C (!) 36.1 C  SpO2: 99% 99%    Last Pain:  Vitals:   04/17/22 1300  TempSrc:   PainSc: 0-No pain                 Darrin Nipper

## 2022-04-19 ENCOUNTER — Encounter: Payer: Self-pay | Admitting: Ophthalmology

## 2022-06-14 ENCOUNTER — Encounter: Payer: Self-pay | Admitting: *Deleted

## 2022-06-14 NOTE — Telephone Encounter (Signed)
PA on file (5/23) & good for as long as she is on Prolia and insurance doesn't change.

## 2022-06-19 ENCOUNTER — Ambulatory Visit
Admission: RE | Admit: 2022-06-19 | Discharge: 2022-06-19 | Disposition: A | Discharge status: Home / Self Care | Payer: Medicare Other | Source: Ambulatory Visit | Attending: Family | Admitting: Family

## 2022-06-19 DIAGNOSIS — Z1231 Encounter for screening mammogram for malignant neoplasm of breast: Secondary | ICD-10-CM | POA: Insufficient documentation

## 2022-07-05 ENCOUNTER — Other Ambulatory Visit: Payer: Self-pay | Admitting: Family

## 2022-07-05 DIAGNOSIS — E785 Hyperlipidemia, unspecified: Secondary | ICD-10-CM

## 2022-07-16 ENCOUNTER — Ambulatory Visit (INDEPENDENT_AMBULATORY_CARE_PROVIDER_SITE_OTHER): Payer: Medicare Other

## 2022-07-16 DIAGNOSIS — M810 Age-related osteoporosis without current pathological fracture: Secondary | ICD-10-CM | POA: Diagnosis not present

## 2022-07-16 MED ORDER — DENOSUMAB 60 MG/ML ~~LOC~~ SOSY
60.0000 mg | PREFILLED_SYRINGE | Freq: Once | SUBCUTANEOUS | Status: AC
  Administered 2022-07-16: 60 mg via SUBCUTANEOUS

## 2022-07-16 NOTE — Progress Notes (Signed)
Patient presented for Prolia injection to left arm patient voiced no concerns nor showed any signs of distress during injection  

## 2022-07-17 ENCOUNTER — Ambulatory Visit: Payer: Medicare Other

## 2022-10-30 ENCOUNTER — Telehealth: Payer: Self-pay | Admitting: Family

## 2022-10-30 NOTE — Telephone Encounter (Signed)
Copied from CRM [id number]. Topic: Medicare AWV >> Oct 30, 2022  2:28 PM Rushie Goltz wrote: Reason for CRM: Contacted Dhwani Venkatesh to schedule their annual wellness visit. Call back at later date: 10/31/2022  Patient was grocery shopping  Thank you,  [facility] Support [facility] Direct dial  [phone]

## 2022-10-31 ENCOUNTER — Telehealth: Payer: Self-pay | Admitting: Family

## 2022-10-31 NOTE — Telephone Encounter (Signed)
Called patient to schedule Medicare Annual Wellness Visit (AWV). Left message for patient to call back and schedule Medicare Annual Wellness Visit (AWV).  Last date of AWV: 10/29/2021   Please schedule an AWVS appointment at any time with [facility] ANNUAL WELLNESS VISIT.  If any questions, please contact me at [phone].    Thank you,  Querida Beretta  [facility] Support [facility] Direct dial  [phone]   

## 2022-10-31 NOTE — Telephone Encounter (Signed)
Pt returned Janet Shields call. Note below was read to her. I scheduled her AWV for 4/25 :30am

## 2022-11-07 ENCOUNTER — Ambulatory Visit (INDEPENDENT_AMBULATORY_CARE_PROVIDER_SITE_OTHER): Payer: Medicare Other

## 2022-11-07 VITALS — Ht 59.0 in | Wt 115.0 lb

## 2022-11-07 DIAGNOSIS — Z780 Asymptomatic menopausal state: Secondary | ICD-10-CM

## 2022-11-07 DIAGNOSIS — Z0000 Encounter for general adult medical examination without abnormal findings: Secondary | ICD-10-CM

## 2022-11-07 NOTE — Progress Notes (Signed)
Subjective:   Janet Shields is a 87 y.o. female who presents for Medicare Annual (Subsequent) preventive examination.  Review of Systems    No ROS.  Medicare Wellness Virtual Visit.  Visual/audio telehealth visit, UTA vital signs.   See social history for additional risk factors.   Cardiac Risk Factors include: advanced age (>73men, >46 women)     Objective:    Today's Vitals   11/07/22 1035  Weight: 115 lb (52.2 kg)  Height: 4\' 11"  (1.499 m)   Body mass index is 23.23 kg/m.     11/07/2022   10:44 AM 04/17/2022   11:11 AM 10/29/2021   10:39 AM 10/26/2020   11:03 AM 10/22/2019    1:52 PM 10/17/2017    4:59 PM 05/04/2015    1:25 PM  Advanced Directives  Does Patient Have a Medical Advance Directive? Yes Yes Yes Yes Yes Yes Yes  Type of Estate agent of [place];Living will Living will;Healthcare Power of [employer] Power of [place];Living will Healthcare Power of [place];Living will Healthcare Power of [place];Living will Living will Healthcare Power of [place];Living will  Does patient want to make changes to medical advance directive? No - Patient declined  No - Patient declined No - Patient declined No - Patient declined No - Patient declined   Copy of Healthcare Power of Attorney in Chart? No - copy requested No - copy requested No - copy requested No - copy requested No - copy requested  No - copy requested    Current Medications (verified) Outpatient Encounter Medications as of 11/07/2022  Medication Sig   Biotin 5000 MCG TABS Take by mouth daily.   Calcium Citrate-Vitamin D (CAL-CITRATE PLUS VITAMIN D PO) Take 1 tablet by mouth daily.   CRANBERRY-VITAMIN C PO Take by mouth daily.   denosumab (PROLIA) 60 MG/ML SOLN injection Inject 60 mg into the skin every 6 (six) months. Administer in upper arm, thigh, or abdomen   estradiol (ESTRACE) 0.1 MG/GM vaginal cream Estrogen Cream Instruction  Discard applicator  Apply pea sized amount to  tip of finger to urethra before bed. Wash hands well after application. Use Monday, Wednesday and Friday (Patient not taking: Reported on 04/09/2022)   latanoprost (XALATAN) 0.005 % ophthalmic solution 1 drop at bedtime.   Multiple Vitamin (MULTIVITAMIN) tablet Take 1 tablet by mouth daily.   saccharomyces boulardii (FLORASTOR) 250 MG capsule Take 250 mg by mouth daily.   simvastatin (ZOCOR) 20 MG tablet TAKE 1 TABLET(20 MG) BY MOUTH DAILY AT 6 PM   No facility-administered encounter medications on file as of 11/07/2022.    Allergies (verified) Tramadol   History: Past Medical History:  Diagnosis Date   History of colon polyps    Hyperlipidemia    Osteoporosis    Osteoporosis    Past Surgical History:  Procedure Laterality Date   BREAST BIOPSY  2007   CORE W/CLIP - NEG   CATARACT EXTRACTION W/ INTRAOCULAR LENS IMPLANT Left 07/2018   CATARACT EXTRACTION W/PHACO Right 04/17/2022   Procedure: CATARACT EXTRACTION PHACO AND INTRAOCULAR LENS PLACEMENT (IOC) RIGHT  CLARION TORIC LENS 15.36 01:45.8;  Surgeon: Lockie Mola, MD;  [facility];  Service: Ophthalmology;  Laterality: Right;   Family History  Problem Relation Age of Onset   Arthritis Mother    Alcohol abuse Father    Heart disease Father 4       CAD - died   Hyperlipidemia Brother    Diabetes Brother    Hypertension Brother  Diabetes Brother    Hyperlipidemia Brother    CAD Brother        quadruple bypass   Diabetes Brother    Breast cancer Neg Hx    Social History   Socioeconomic History   Marital status: Single    Spouse name: Not on file   Number of children: 1   Years of education: 18   Highest education level: Not on file  Occupational History   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  Tobacco Use   Smoking status: Never   Smokeless tobacco: Never  Vaping Use   Vaping Use: Never used  Substance and Sexual Activity   Alcohol use: Yes    Comment: Occasional wine  with dinner   Drug use: No   Sexual activity: Not Currently  Other Topics Concern   Not on file  Social History Narrative   Hobbies: flower gardening, read, travel   Caffeine: 4 cups of coffee   Exercise: walk 3 miles on treadmill 3 a week, resistance exercises [employer]   Social Determinants of Health   Financial Resource Strain: Low Risk  (11/07/2022)   Overall Financial Resource Strain (CARDIA)    Difficulty of Paying Living Expenses: Not hard at all  Food Insecurity: No Food Insecurity (11/07/2022)   Hunger Vital Sign    Worried About Running Out of Food in the Last Year: Never true    Ran Out of Food in the Last Year: Never true  Transportation Needs: No Transportation Needs (11/07/2022)   PRAPARE - Administrator, Civil Service (Medical): No    Lack of Transportation (Non-Medical): No  Physical Activity: Sufficiently Active (11/07/2022)   Exercise Vital Sign    Days of Exercise per Week: 5 days    Minutes of Exercise per Session: 60 min  Stress: No Stress Concern Present (11/07/2022)   [employer] of Occupational Health - Occupational Stress Questionnaire    Feeling of Stress : Not at all  Social Connections: Unknown (11/07/2022)   Social Connection and Isolation Panel [NHANES]    Frequency of Communication with Friends and Family: More than three times a week    Frequency of Social Gatherings with Friends and Family: More than three times a week    Attends Religious Services: More than 4 times per year    Active Member of [employer] or Organizations: Yes    Attends Engineer, structural: More than 4 times per year    Marital Status: Not on file    Tobacco Counseling Counseling given: Not Answered   Clinical Intake:  Pre-visit preparation completed: Yes        Diabetes: No  How often do you need to have someone help you when you read instructions, pamphlets, or other written materials from your doctor or pharmacy?: 1 - Never    Interpreter  Needed?: No      Activities of Daily Living    11/07/2022   10:45 AM 04/17/2022   11:10 AM  In your present state of health, do you have any difficulty performing the following activities:  Hearing? 1 0  Comment Hearing aids   Vision? 0 0  Difficulty concentrating or making decisions? 0 0  Walking or climbing stairs? 0 0  Dressing or bathing? 0 0  Doing errands, shopping? 0   Preparing Food and eating ? N   Using the Toilet? N   In the past six months, have you accidently leaked urine? N  Do you have problems with loss of bowel control? N   Managing your Medications? N   Managing your Finances? N   Housekeeping or managing your Housekeeping? N     Patient Care Team: Allegra Grana, FNP as PCP - General (Family Medicine)  Indicate any recent Medical Services you may have received from other than [facility] providers in the past year (date may be approximate).     Assessment:   This is a routine wellness examination for [place].  I connected with  Thelma Comp on 11/07/22 by a audio enabled telemedicine application and verified that I am speaking with the correct person using two identifiers.  Patient Location: Home  Provider Location: Office/Clinic  I discussed the limitations of evaluation and management by telemedicine. The patient expressed understanding and agreed to proceed.   Hearing/Vision screen Hearing Screening - Comments:: Hearing aid, bilateral  Vision Screening - Comments:: Cataract extraction, bilateral Wears sunglasses when in the sun. Glaucoma; drops in use. Seen their ophthalmologist every 4-6 months.    Dietary issues and exercise activities discussed: Current Exercise Habits: Home exercise routine, Type of exercise: walking, Intensity: Mild   Goals Addressed               This Visit's Progress     Patient Stated     Maintain Healthy Lifestyle (pt-stated)   On track     Stay active (12,000 steps daily) Healthy diet        Depression Screen    11/07/2022   10:45 AM 03/13/2022    1:41 PM 11/07/2021    1:23 PM 10/29/2021   10:50 AM 12/04/2020    8:53 AM 10/26/2020   10:41 AM 10/22/2019    2:08 PM  PHQ 2/9 Scores  PHQ - 2 Score 0 0 0 0 0 0 0  PHQ- 9 Score   0  1  0    Fall Risk    11/07/2022   10:45 AM 03/13/2022    1:41 PM 11/07/2021    1:22 PM 10/29/2021   10:50 AM 01/19/2021    4:07 PM  Fall Risk   Falls in the past year? 0 0 0 0 0  Number falls in past yr: 0 0 0 0   Injury with Fall? 0 0 0    Risk for fall due to :  No Fall Risks No Fall Risks    Follow up Falls prevention discussed;Falls evaluation completed Falls evaluation completed Falls evaluation completed Falls evaluation completed Falls evaluation completed    FALL RISK PREVENTION PERTAINING TO THE HOME: Home free of loose throw rugs in walkways, pet beds, electrical cords, etc? Yes  Adequate lighting in your home to reduce risk of falls? Yes   ASSISTIVE DEVICES UTILIZED TO PREVENT FALLS: Life alert? No  Use of a cane, walker or w/c? No  Grab bars in the bathroom? No  Shower chair or bench in shower? No  Elevated toilet seat or a handicapped toilet? No   TIMED UP AND GO: Was the test performed? No .    Cognitive Function:    10/22/2019    2:55 PM 05/04/2015    1:45 PM  MMSE - Mini Mental State Exam  Not completed: Unable to complete   Orientation to time  5  Orientation to Place  5  Registration  3  Attention/ Calculation  5  Recall  3  Language- name 2 objects  2  Language- repeat  1  Language- follow 3 step command  3  Language- read & follow direction  1  Write a sentence  1  Copy design  1  Total score  30        11/07/2022   10:52 AM  6CIT Screen  What Year? 0 points  What month? 0 points  What time? 0 points  Count back from 20 0 points  Months in reverse 0 points  Repeat phrase 0 points  Total Score 0 points    Immunizations Immunization History  Administered Date(s) Administered   DTaP 10/17/2008    Fluad Quad(high Dose 65+) 03/30/2019   Influenza, High Dose Seasonal PF 04/28/2017   Influenza-Unspecified 04/18/2016, 04/15/2017, 04/14/2020   PFIZER(Purple Top)SARS-COV-2 Vaccination 08/10/2019, 08/31/2019, 05/01/2020   Pneumococcal Conjugate-13 03/24/2014   Pneumococcal Polysaccharide-23 12/23/2006   Td 03/13/2022   Zoster, Live 12/23/2006     Shingrix Completed?: No.    Education has been provided regarding the importance of this vaccine. Patient has been advised to call insurance company to determine out of pocket expense if they have not yet received this vaccine. Advised may also receive vaccine at local pharmacy or Health Dept. Verbalized acceptance and understanding.  Screening Tests Health Maintenance  Topic Date Due   COVID-19 Vaccine (4 - 2023-24 season) 11/23/2022 (Originally 03/15/2022)   Zoster Vaccines- Shingrix (1 of 2) 02/06/2023 (Originally [birth year]/30/1988)   INFLUENZA VACCINE  02/13/2023   Medicare Annual Wellness (AWV)  11/07/2023   DTaP/Tdap/Td (3 - Tdap) 03/13/2032   Pneumonia Vaccine 66+ Years old  Completed   DEXA SCAN  Completed   HPV VACCINES  Aged Out   Health Maintenance There are no preventive care reminders to display for this patient.  Bone density- ordered per request.  Lung Cancer Screening: (Low Dose CT Chest recommended if Age 7-80 years, 30 pack-year currently smoking OR have quit w/in 15years.) does not qualify.   Vision Screening: Recommended annual ophthalmology exams for early detection of glaucoma and other disorders of the eye.  Dental Screening: Recommended annual dental exams for proper oral hygiene  Community Resource Referral / Chronic Care Management: CRR required this visit?  No   CCM required this visit?  No      Plan:     I have personally reviewed and noted the following in the patient's chart:   Medical and social history Use of alcohol, tobacco or illicit drugs  Current medications and supplements including opioid  prescriptions. Patient is not currently taking opioid prescriptions. Functional ability and status Nutritional status Physical activity Advanced directives List of other physicians Hospitalizations, surgeries, and ER visits in previous 12 months Vitals Screenings to include cognitive, depression, and falls Referrals and appointments  In addition, I have reviewed and discussed with patient certain preventive protocols, quality metrics, and best practice recommendations. A written personalized care plan for preventive services as well as general preventive health recommendations were provided to patient.     Cathey Endow, LPN   03/28/7828

## 2022-11-07 NOTE — Patient Instructions (Addendum)
Janet Shields , Thank you for taking time to come for your Medicare Wellness Visit. I appreciate your ongoing commitment to your health goals. Please review the following plan we discussed and let me know if I can assist you in the future.   These are the goals we discussed:  Goals       Patient Stated     Maintain Healthy Lifestyle (pt-stated)      Stay active (12,000 steps daily) Healthy diet        This is a list of the screening recommended for you and due dates:  Health Maintenance  Topic Date Due   COVID-19 Vaccine (4 - 2023-24 season) 11/23/2022*   Zoster (Shingles) Vaccine (1 of 2) 02/06/2023*   Flu Shot  02/13/2023   Medicare Annual Wellness Visit  11/07/2023   DTaP/Tdap/Td vaccine (3 - Tdap) 03/13/2032   Pneumonia Vaccine  Completed   DEXA scan (bone density measurement)  Completed   HPV Vaccine  Aged Out  *Topic was postponed. The date shown is not the original due date.    Advanced directives: End of life planning; Advance aging; Advanced directives discussed.  Copy of current HCPOA/Living Will requested.    Conditions/risks identified: none new  Next appointment: Follow up in one year for your annual wellness visit    Preventive Care 65 Years and Older, Female Preventive care refers to lifestyle choices and visits with your health care provider that can promote health and wellness. What does preventive care include? A yearly physical exam. This is also called an annual well check. Dental exams once or twice a year. Routine eye exams. Ask your health care provider how often you should have your eyes checked. Personal lifestyle choices, including: Daily care of your teeth and gums. Regular physical activity. Eating a healthy diet. Avoiding tobacco and drug use. Limiting alcohol use. Practicing safe sex. Taking low-dose aspirin every day. Taking vitamin and mineral supplements as recommended by your health care provider. What happens during an annual well  check? The services and screenings done by your health care provider during your annual well check will depend on your age, overall health, lifestyle risk factors, and family history of disease. Counseling  Your health care provider may ask you questions about your: Alcohol use. Tobacco use. Drug use. Emotional well-being. Home and relationship well-being. Sexual activity. Eating habits. History of falls. Memory and ability to understand (cognition). Work and work Astronomer. Reproductive health. Screening  You may have the following tests or measurements: Height, weight, and BMI. Blood pressure. Lipid and cholesterol levels. These may be checked every 5 years, or more frequently if you are over 56 years old. Skin check. Lung cancer screening. You may have this screening every year starting at age 42 if you have a 30-pack-year history of smoking and currently smoke or have quit within the past 15 years. Fecal occult blood test (FOBT) of the stool. You may have this test every year starting at age 33. Flexible sigmoidoscopy or colonoscopy. You may have a sigmoidoscopy every 5 years or a colonoscopy every 10 years starting at age 18. Hepatitis C blood test. Hepatitis B blood test. Sexually transmitted disease (STD) testing. Diabetes screening. This is done by checking your blood sugar (glucose) after you have not eaten for a while (fasting). You may have this done every 1-3 years. Bone density scan. This is done to screen for osteoporosis. You may have this done starting at age 65. Mammogram. This may be done every 1-2  years. Talk to your health care provider about how often you should have regular mammograms. Talk with your health care provider about your test results, treatment options, and if necessary, the need for more tests. Vaccines  Your health care provider may recommend certain vaccines, such as: Influenza vaccine. This is recommended every year. Tetanus, diphtheria, and  acellular pertussis (Tdap, Td) vaccine. You may need a Td booster every 10 years. Zoster vaccine. You may need this after age 15. Pneumococcal 13-valent conjugate (PCV13) vaccine. One dose is recommended after age 34. Pneumococcal polysaccharide (PPSV23) vaccine. One dose is recommended after age 61. Talk to your health care provider about which screenings and vaccines you need and how often you need them. This information is not intended to replace advice given to you by your health care provider. Make sure you discuss any questions you have with your health care provider. Document Released: 07/28/2015 Document Revised: 03/20/2016 Document Reviewed: 05/02/2015 Elsevier Interactive Patient Education  2017 [place] Prevention in the Home Falls can cause injuries. They can happen to people of all ages. There are many things you can do to make your home safe and to help prevent falls. What can I do on the outside of my home? Regularly fix the edges of walkways and driveways and fix any cracks. Remove anything that might make you trip as you walk through a door, such as a raised step or threshold. Trim any bushes or trees on the path to your home. Use bright outdoor lighting. Clear any walking paths of anything that might make someone trip, such as rocks or tools. Regularly check to see if handrails are loose or broken. Make sure that both sides of any steps have handrails. Any raised decks and porches should have guardrails on the edges. Have any leaves, snow, or ice cleared regularly. Use sand or salt on walking paths during winter. Clean up any spills in your garage right away. This includes oil or grease spills. What can I do in the bathroom? Use night lights. Install grab bars by the toilet and in the tub and shower. Do not use towel bars as grab bars. Use non-skid mats or decals in the tub or shower. If you need to sit down in the shower, use a plastic, non-slip stool. Keep  the floor dry. Clean up any water that spills on the floor as soon as it happens. Remove soap buildup in the tub or shower regularly. Attach bath mats securely with double-sided non-slip rug tape. Do not have throw rugs and other things on the floor that can make you trip. What can I do in the bedroom? Use night lights. Make sure that you have a light by your bed that is easy to reach. Do not use any sheets or blankets that are too big for your bed. They should not hang down onto the floor. Have a firm chair that has side arms. You can use this for support while you get dressed. Do not have throw rugs and other things on the floor that can make you trip. What can I do in the kitchen? Clean up any spills right away. Avoid walking on wet floors. Keep items that you use a lot in easy-to-reach places. If you need to reach something above you, use a strong step stool that has a grab bar. Keep electrical cords out of the way. Do not use floor polish or wax that makes floors slippery. If you must use wax, use non-skid floor  wax. Do not have throw rugs and other things on the floor that can make you trip. What can I do with my stairs? Do not leave any items on the stairs. Make sure that there are handrails on both sides of the stairs and use them. Fix handrails that are broken or loose. Make sure that handrails are as long as the stairways. Check any carpeting to make sure that it is firmly attached to the stairs. Fix any carpet that is loose or worn. Avoid having throw rugs at the top or bottom of the stairs. If you do have throw rugs, attach them to the floor with carpet tape. Make sure that you have a light switch at the top of the stairs and the bottom of the stairs. If you do not have them, ask someone to add them for you. What else can I do to help prevent falls? Wear shoes that: Do not have high heels. Have rubber bottoms. Are comfortable and fit you well. Are closed at the toe. Do not  wear sandals. If you use a stepladder: Make sure that it is fully opened. Do not climb a closed stepladder. Make sure that both sides of the stepladder are locked into place. Ask someone to hold it for you, if possible. Clearly mark and make sure that you can see: Any grab bars or handrails. First and last steps. Where the edge of each step is. Use tools that help you move around (mobility aids) if they are needed. These include: Canes. Walkers. Scooters. Crutches. Turn on the lights when you go into a dark area. Replace any light bulbs as soon as they burn out. Set up your furniture so you have a clear path. Avoid moving your furniture around. If any of your floors are uneven, fix them. If there are any pets around you, be aware of where they are. Review your medicines with your doctor. Some medicines can make you feel dizzy. This can increase your chance of falling. Ask your doctor what other things that you can do to help prevent falls. This information is not intended to replace advice given to you by your health care provider. Make sure you discuss any questions you have with your health care provider. Document Released: 04/27/2009 Document Revised: 12/07/2015 Document Reviewed: 08/05/2014 Elsevier Interactive Patient Education  2017 [employer].

## 2022-12-06 ENCOUNTER — Ambulatory Visit (INDEPENDENT_AMBULATORY_CARE_PROVIDER_SITE_OTHER): Payer: Medicare Other

## 2022-12-06 ENCOUNTER — Ambulatory Visit (INDEPENDENT_AMBULATORY_CARE_PROVIDER_SITE_OTHER): Payer: Medicare Other | Admitting: Family

## 2022-12-06 ENCOUNTER — Encounter: Payer: Self-pay | Admitting: Family

## 2022-12-06 VITALS — BP 118/64 | HR 73 | Temp 97.4°F | Ht 59.0 in | Wt 116.0 lb

## 2022-12-06 DIAGNOSIS — M25532 Pain in left wrist: Secondary | ICD-10-CM

## 2022-12-06 DIAGNOSIS — E785 Hyperlipidemia, unspecified: Secondary | ICD-10-CM | POA: Diagnosis not present

## 2022-12-06 DIAGNOSIS — R7303 Prediabetes: Secondary | ICD-10-CM

## 2022-12-06 DIAGNOSIS — M810 Age-related osteoporosis without current pathological fracture: Secondary | ICD-10-CM | POA: Diagnosis not present

## 2022-12-06 DIAGNOSIS — I700 Atherosclerosis of aorta: Secondary | ICD-10-CM

## 2022-12-06 DIAGNOSIS — Z0001 Encounter for general adult medical examination with abnormal findings: Secondary | ICD-10-CM | POA: Diagnosis not present

## 2022-12-06 DIAGNOSIS — Z0000 Encounter for general adult medical examination without abnormal findings: Secondary | ICD-10-CM

## 2022-12-06 LAB — COMPREHENSIVE METABOLIC PANEL
ALT: 22 U/L (ref 0–35)
AST: 24 U/L (ref 0–37)
Albumin: 4.3 g/dL (ref 3.5–5.2)
Alkaline Phosphatase: 29 U/L — ABNORMAL LOW (ref 39–117)
BUN: 15 mg/dL (ref 6–23)
CO2: 32 mEq/L (ref 19–32)
Calcium: 9.4 mg/dL (ref 8.4–10.5)
Chloride: 102 mEq/L (ref 96–112)
Creatinine, Ser: 0.61 mg/dL (ref 0.40–1.20)
GFR: 81.99 mL/min (ref >60.00)
Glucose, Bld: 99 mg/dL (ref 70–99)
Potassium: 4.1 mEq/L (ref 3.5–5.1)
Sodium: 142 mEq/L (ref 135–145)
Total Bilirubin: 0.5 mg/dL (ref 0.2–1.2)
Total Protein: 6.9 g/dL (ref 6.0–8.3)

## 2022-12-06 LAB — LIPID PANEL
Cholesterol: 160 mg/dL (ref 0–200)
HDL: 62.8 mg/dL (ref >39.00)
LDL Cholesterol: 83 mg/dL (ref 0–99)
NonHDL: 97.31
Total CHOL/HDL Ratio: 3
Triglycerides: 73 mg/dL (ref 0.0–149.0)
VLDL: 14.6 mg/dL (ref 0.0–40.0)

## 2022-12-06 LAB — LDL CHOLESTEROL, DIRECT: Direct LDL: 89 mg/dL

## 2022-12-06 LAB — CBC WITH DIFFERENTIAL/PLATELET
Basophils Absolute: 0 10*3/uL (ref 0.0–0.1)
Basophils Relative: 0.9 % (ref 0.0–3.0)
Eosinophils Absolute: 0.2 10*3/uL (ref 0.0–0.7)
Eosinophils Relative: 4.1 % (ref 0.0–5.0)
HCT: 41.6 % (ref 36.0–46.0)
Hemoglobin: 13.7 g/dL (ref 12.0–15.0)
Lymphocytes Relative: 29.6 % (ref 12.0–46.0)
Lymphs Abs: 1.2 10*3/uL (ref 0.7–4.0)
MCHC: 32.9 g/dL (ref 30.0–36.0)
MCV: 91.3 fl (ref 78.0–100.0)
Monocytes Absolute: 0.5 10*3/uL (ref 0.1–1.0)
Monocytes Relative: 11.5 % (ref 3.0–12.0)
Neutro Abs: 2.2 10*3/uL (ref 1.4–7.7)
Neutrophils Relative %: 53.9 % (ref 43.0–77.0)
Platelets: 233 10*3/uL (ref 150.0–400.0)
RBC: 4.56 Mil/uL (ref 3.87–5.11)
RDW: 13.7 % (ref 11.5–15.5)
WBC: 4 10*3/uL (ref 4.0–10.5)

## 2022-12-06 LAB — TSH: TSH: 1.77 u[IU]/mL (ref 0.35–5.50)

## 2022-12-06 LAB — HEMOGLOBIN A1C: Hgb A1c MFr Bld: 6.1 % (ref 4.6–6.5)

## 2022-12-06 LAB — VITAMIN D 25 HYDROXY (VIT D DEFICIENCY, FRACTURES): VITD: 61.35 ng/mL (ref 30.00–100.00)

## 2022-12-06 MED ORDER — MELOXICAM 7.5 MG PO TABS: 7.5000 mg | ORAL_TABLET | Freq: Every day | ORAL | 1 refills | Status: DC | PRN

## 2022-12-06 NOTE — Progress Notes (Signed)
Assessment & Plan:  Routine physical examination Assessment & Plan: Patient declines clinical breast exam.  Mammogram is up-to-date.  She is no longer screening for colon cancer, cervical cancer.  Congratulated patient on diligence to exercise.    Hyperlipidemia, unspecified hyperlipidemia type -     CBC with Differential/Platelet -     Comprehensive metabolic panel -     LDL cholesterol, direct -     TSH -     Lipid panel  Left wrist pain Assessment & Plan: Benign exam.  I do question if the degree of osteoarthritis is playing a role.  Pending x-ray.  We discussed starting meloxicam for very brief period of time.  Counseled on the importance of taking with food to prevent gastritis.  If no improvement, consider orthopedic consult  Orders: -     Meloxicam; Take 1 tablet (7.5 mg total) by mouth daily as needed for pain.  Dispense: 30 tablet; Refill: 1 -     DG Wrist Complete Left; Future  Osteoporosis, unspecified osteoporosis type, unspecified pathological fracture presence -     Comprehensive metabolic panel -     VITAMIN D 25 Hydroxy (Vit-D Deficiency, Fractures)  Prediabetes -     Hemoglobin A1c  Atherosclerosis of aorta (HCC) Assessment & Plan: Chronic, symptomatically stable.  Continue simvastatin 20 mg daily.  Pending lipid panel.       Return precautions given.   Risks, benefits, and alternatives of the medications and treatment plan prescribed today were discussed, and patient expressed understanding.   Education regarding symptom management and diagnosis given to patient on AVS either electronically or printed.  No follow-ups on file.  Janet Plowman, FNP  Subjective:    Patient ID: Janet Shields, female    DOB: Jun 06, 1936, 87 y.o.   MRN: [id number]  CC: Janet Shields is a 87 y.o. female who presents today for physical exam.    HPI: She complains left wrist pain for one month, unchanged.   She felt the area was swollen. Pain will radiated down  left thumb and also up to the midarm.  She has pain with picking up a coffee cup.  She has started Aleve with out much change in symptoms.  She is wearing neoprene wrap.   No numbness, falls, injury   She remains compliant with Zocor.  No chest pain, shortness of breath  Colorectal Cancer Screening: She is no longer screening for colon cancer Breast Cancer Screening: Mammogram UTD Cervical Cancer Screening: No history of cervical cancer.  No longer screening for cervical cancer Bone Health screening/DEXA for 65+: scheduled  Lung Cancer Screening: Doesn't have 20 year pack year history and age > 85 years yo 87 years       Tetanus - UTD        Pneumococcal - Complete  Exercise: Gets regular exercise on treadmill with walking daily.   Alcohol use:  occasional Smoking/tobacco use: Nonsmoker.    Health Maintenance  Topic Date Due   COVID-19 Vaccine (4 - 2023-24 season) 03/15/2022   Zoster (Shingles) Vaccine (1 of 2) 02/06/2023*   Flu Shot  02/13/2023   Medicare Annual Wellness Visit  11/07/2023   DTaP/Tdap/Td vaccine (3 - Tdap) 03/13/2032   Pneumonia Vaccine  Completed   DEXA scan (bone density measurement)  Completed   HPV Vaccine  Aged Out  *Topic was postponed. The date shown is not the original due date.    ALLERGIES: Tramadol  Current Outpatient Medications on File Prior to Visit  Medication  Sig Dispense Refill   Biotin 5000 MCG TABS Take by mouth daily.     Calcium Citrate-Vitamin D (CAL-CITRATE PLUS VITAMIN D PO) Take 1 tablet by mouth daily.     CRANBERRY-VITAMIN C PO Take by mouth daily.     denosumab (PROLIA) 60 MG/ML SOLN injection Inject 60 mg into the skin every 6 (six) months. Administer in upper arm, thigh, or abdomen     latanoprost (XALATAN) 0.005 % ophthalmic solution 1 drop at bedtime.     Multiple Vitamin (MULTIVITAMIN) tablet Take 1 tablet by mouth daily.     saccharomyces boulardii (FLORASTOR) 250 MG capsule Take 250 mg by mouth daily.     simvastatin  (ZOCOR) 20 MG tablet TAKE 1 TABLET(20 MG) BY MOUTH DAILY AT 6 PM 30 tablet 5   timolol (TIMOPTIC) 0.5 % ophthalmic solution Place 1 drop into both eyes daily.     No current facility-administered medications on file prior to visit.    Review of Systems  Constitutional:  Negative for chills, fever and unexpected weight change.  HENT:  Negative for congestion.   Respiratory:  Negative for cough.   Cardiovascular:  Negative for chest pain, palpitations and leg swelling.  Gastrointestinal:  Negative for nausea and vomiting.  Musculoskeletal:  Positive for arthralgias. Negative for myalgias.  Skin:  Negative for rash.  Neurological:  Negative for numbness and headaches.  Hematological:  Negative for adenopathy.  Psychiatric/Behavioral:  Negative for confusion.       Objective:    BP 118/64   Pulse 73   Temp (!) 97.4 F (36.3 C) (Oral)   Ht 4\' 11"  (1.499 m)   Wt 116 lb (52.6 kg)   SpO2 97%   BMI 23.43 kg/m   BP Readings from Last 3 Encounters:  12/06/22 118/64  04/17/22 126/60  03/13/22 110/70   Wt Readings from Last 3 Encounters:  12/06/22 116 lb (52.6 kg)  11/07/22 115 lb (52.2 kg)  04/17/22 115 lb 14.4 oz (52.6 kg)    Physical Exam Vitals reviewed.  Constitutional:      Appearance: She is well-developed.  Eyes:     Conjunctiva/sclera: Conjunctivae normal.  Cardiovascular:     Rate and Rhythm: Normal rate and regular rhythm.     Pulses: Normal pulses.     Heart sounds: Normal heart sounds.  Pulmonary:     Effort: Pulmonary effort is normal.     Breath sounds: Normal breath sounds. No wheezing, rhonchi or rales.  Musculoskeletal:     Left elbow: No swelling, deformity or effusion. Normal range of motion. No tenderness.     Left forearm: No tenderness or bony tenderness.     Left wrist: No swelling, effusion or tenderness. Normal range of motion. Normal pulse.     Comments: No pain or masses, edema appreciated left upper or lower arm.  No bony step-off.  Palpable  bilateral radial pulse.    Skin:    General: Skin is warm and dry.  Neurological:     Mental Status: She is alert.  Psychiatric:        Speech: Speech normal.        Behavior: Behavior normal.        Thought Content: Thought content normal.

## 2022-12-06 NOTE — Patient Instructions (Signed)
Ice 20 minutes twice per day  I have printed you lots of exercises below. If these exercises cause pain to worsen please stop immediately.  Trial of mobic A couple of points in regards to meloxicam ( Mobic) -  This medication is not intended for daily , long term use. It is a potent anti inflammatory ( NSAID), and my intention is for you take as needed for moderate to severe pain. If you find yourself using daily, please let me know.   Please takes Mobic ( meloxicam) with FOOD since it is an anti-inflammatory as it can cause a GI bleed or ulcer. If you have a history of GI bleed or ulcer, please do NOT take.  Do no take over the counter aleve, motrin, advil, goody's powder for pain as they are also NSAIDs, and they are  in the same class as Mobic  Lastly, we will need to monitor kidney function while on Mobic, and if we were to see any decline in kidney function in the future, we would have to discontinue this medication.   Wrist and Forearm Exercises Ask your health care provider which exercises are safe for you. Do exercises exactly as told by your provider and adjust them as directed. It is normal to feel mild stretching, pulling, tightness, or discomfort as you do these exercises. Stop right away if you feel sudden pain or your pain gets worse. Do not begin these exercises until told by your provider. Range-of-motion exercises These exercises warm up your muscles and joints and improve the movement and flexibility of your injured wrist and forearm. These exercises also help to relieve pain, numbness, and tingling. These exercises are done using the muscles in your injured wrist and forearm.  Wrist Sprain Rehab Ask your health care provider which exercises are safe for you. Do exercises exactly as told by your health care provider and adjust them as directed. It is normal to feel mild stretching, pulling, tightness, or discomfort as you do these exercises. Stop right away if you feel sudden  pain or your pain gets worse. Do not begin these exercises until told by your health care provider. Stretching and range-of-motion exercises These exercises warm up your muscles and joints and improve the movement and flexibility of your wrist. These exercises also help to relieve pain, numbness, and tingling. Wrist flexion range of motion Bend your left / right elbow to a 90-degree angle (right angle) with your palm facing the floor. Bend your wrist forward so your fingers point toward the floor (flexion). Hold this position for __________ seconds. Slowly return to the starting position. Repeat __________ times. Complete this exercise __________ times a day. Wrist extension range of motion Bend your left / right elbow to a 90-degree angle (right angle) with your palm facing the floor. Bend your wrist backward so your fingers point toward the ceiling (extension). Hold this position for __________ seconds. Slowly return to the starting position. Repeat __________ times. Complete this exercise __________ times a day. Ulnar deviation range of motion Bend your left / right elbow to a 90-degree angle (right angle), and rest your forearm on a table with your palm facing down. Keeping your hand flat on the table, bend your left / right wrist toward your small finger (pinkie). This is ulnar deviation. Hold this position for __________ seconds. Slowly return your palm to the starting position. Repeat __________ times. Complete this exercise __________ times a day. Radial deviation range of motion Bend your left / right elbow  to a 90-degree angle (right angle), and rest your forearm on a table with your palm facing down. Keeping your hand flat on the table, bend your left / right wrist toward your thumb. This is radial deviation. Hold this position for __________ seconds. Slowly return your palm to the starting position. Repeat __________ times. Complete this exercise __________ times a  day. Dart-thrower's motion This exercise combines all four wrist motions--wrist flexion, wrist extension, ulnar deviation, and radial deviation--into one exercise. The motion is similar to throwing a dart. Sit and rest your left / right elbow on a table. Start with your wrist straight. Keep your fingers relaxed during the exercise. In the same motion, bend your wrist backward (extension) and toward your thumb (radial deviation). Move slowly and go as far as you can. Then, in the same motion, bend your wrist forward (flexion) and toward your small (pinkie) finger (ulnar deviation). Move slowly and go as far as you can. Slowly return to the starting position. Repeat __________ times. Complete this exercise __________ times a day. Wrist flexion stretch  Extend your left / right arm in front of you, and turn your palm down toward the floor. If told by your health care provider, bend your left / right elbow to a 90-degree angle (right angle) at your side. Using your uninjured hand, gently press over the back of your left / right hand to bend your wrist and fingers toward the floor (flexion). Go as far as you can to feel a stretch without causing pain. Hold this position for __________ seconds. Slowly return to the starting position. Repeat __________ times. Complete this exercise __________ times a day. Wrist extension stretch  Extend your left / right arm in front of you and turn your palm up toward the ceiling. If told by your health care provider, bend your left / right elbow to a 90-degree angle (right angle) at your side. Using your uninjured hand, gently press over the palm of your left / right hand to bend your wrist and fingers toward the floor (extension). Go as far as you can to feel a stretch without causing pain. Hold this position for __________ seconds. Slowly return to the starting position. Repeat __________ times. Complete this exercise __________ times a day. Ulnar deviation  stretch Bend your left / right elbow to a 90-degree angle (right angle). Rest your forearm, wrist, and hand on a table with your palm facing the floor. Place your uninjured hand over the top of your left / right hand to keep it from moving during the exercise. Gently move your left / right elbow away from your body. This will turn your wrist toward your small finger (pinkie). This is ulnar deviation. Hold this position for __________ seconds. Slowly return to the starting position. Repeat __________ times. Complete this exercise __________ times a day. Radial deviation stretch Bend your left / right elbow to a 90-degree angle (right angle). Rest your forearm, wrist, and hand on a table with your palm facing the floor. Place your uninjured hand over the top of your left / right hand to keep it from moving during the exercise. Gently move your left / right elbow toward your body. This will turn your wrist toward your thumb (radial deviation). Hold this position for __________ seconds. Slowly return to the starting position. Repeat __________ times. Complete this exercise __________ times a day. Strengthening exercises These exercises build strength and endurance in your wrist. Endurance is the ability to use your muscles for  a long time, even after they get tired. The first 4 exercises are isometric, which means creating tension in a muscle or group of muscles without moving the joint. Isometric wrist flexion Bend your left / right elbow to a 90-degree angle (right angle). Rest your forearm, wrist, and hand on a table with your palm facing the floor. Without moving any joints (isometric), tighten your muscles as if you are trying to bend (flex) your wrist. Start with gentle effort and increase your effort as tolerated. Hold this position for __________ seconds. Then relax your muscles. Repeat __________ times. Complete this exercise __________ times a day. Isometric wrist extension Bend your  left / right elbow to a 90-degree angle (right angle). Rest your forearm, wrist, and hand on a table with your palm facing the floor. Place your unaffected hand over the top of the left / right hand. Without moving any joints (isometric), tighten your muscles to lift your left / right hand off the table (extension). Use the unaffected hand to keep the left / right hand on the table. Start with gentle effort and increase your effort as tolerated. Hold this position for __________ seconds. Then relax your muscles. Repeat __________ times. Complete this exercise __________ times a day. Isometric ulnar deviation Bend your left / right elbow to a 90-degree angle (right angle). Rest your forearm, wrist, and hand on a table with your thumb facing the ceiling (neutral position). Make a light fist with your left / right fingers. Without moving any joints (isometric), tighten your muscles to press the side of your left / right fist into the tabletop (ulnar deviation). Start with gentle effort and increase your effort as tolerated. Hold this position for __________ seconds. Then relax your muscles. Repeat __________ times. Complete this exercise __________ times a day. Isometric radial deviation Bend your left / right elbow to a 90-degree angle (right angle). Rest your forearm, wrist, and hand on a table with your thumb facing the ceiling (neutral position). Make a light fist with your left / right fingers. Place your unaffected hand over the top of your fist. Without moving any joints (isometric), tighten your muscles to lift your left / right fist off the table (radial deviation). Use the unaffected hand to keep the left / right hand on the table. Start with gentle effort and increase your effort as tolerated. Hold this position for __________ seconds. Then relax your muscles. Repeat __________ times. Complete this exercise __________ times a day. Wrist flexion  Sit with your left / right forearm  supported on a table or other surface. Bend your elbow to a 90-degree angle (right angle), and rest your hand palm-up over the edge of the table. Hold a __________ weight in your left / right hand. Or, hold an exercise band or tube in both hands, keeping your hands at the same level and hip distance apart. There should be a slight tension in the exercise band or tube. Slowly curl your hand up toward the ceiling (flexion). Hold this position for __________ seconds. Slowly lower your hand back to the starting position. Repeat __________ times. Complete this exercise __________ times a day. Wrist extension  Sit with your left / right forearm supported on a table or other surface. Bend your elbow to a 90-degree angle (right angle), and rest your hand palm-down over the edge of the table. Hold a __________ weight in your left / right hand. Or, hold an exercise band or tube in both hands, keeping your hands  at the same level and hip distance apart. There should be a slight tension in the exercise band or tube. Slowly curl your hand up toward the ceiling (extension). Hold this position for __________ seconds. Slowly lower your hand back to the starting position. Repeat __________ times. Complete this exercise __________ times a day. This information is not intended to replace advice given to you by your health care provider. Make sure you discuss any questions you have with your health care provider. Document Revised: 11/11/2019 Document Reviewed: 11/11/2019 [employer] Patient Education  2024 [employer]  Wrist flexion  [place] your left / right elbow to a 90-degree angle (right angle) with your palm facing the floor. Bend your wrist so that your fingers point toward the floor (flexion). Hold this position for __________ seconds. Slowly return to the starting position. Repeat __________ times. Complete this exercise __________ times a day. Wrist extension  Bend your left / right elbow to a  90-degree angle (right angle) with your palm facing the floor. Bend your wrist so that your fingers point toward the ceiling (extension). Hold this position for __________ seconds. Slowly return to the starting position. Repeat __________ times. Complete this exercise __________ times a day. Ulnar deviation  Bend your left / right elbow to a 90-degree angle (right angle) and rest your forearm on a table with your palm facing down. Keeping your hand flat on the table, bend your left /right wrist toward your small finger (pinkie). This is ulnar deviation. Hold this position for __________ seconds. Slowly return to the starting position. Repeat __________ times. Complete this exercise __________ times a day. Radial deviation  Bend your left / right elbow to a 90-degree angle (right angle) and rest your forearm on a table with your palm facing down. Keeping your hand flat on the table, bend your left / right wrist toward your thumb. This is radial deviation. Hold this position for __________ seconds. Slowly return to the starting position. Repeat __________ times. Complete this exercise __________ times a day. Stretching These exercises warm up your muscles and joints and improve the movement and flexibility of your injured wrist and forearm. These exercises also help to relieve pain, numbness, and tingling. These exercises are done using your healthy arm to help stretch the muscles in your injured wrist and forearm. Wrist flexion  Reach your left / right arm out in front of you and turn your palm down toward the floor. If told by your provider, bend your left / right elbow to a 90-degree angle (right angle) at your side. Using your uninjured hand, gently press over the back of your left / right hand to bend your wrist and fingers toward the floor (flexion). Go as far as you can to feel a stretch without causing pain. Hold this position for __________ seconds. Slowly return to the starting  position. Repeat __________ times. Complete this exercise __________ times a day. Wrist extension  Reach your left / right arm out in front of you and turn your palm up toward the ceiling. If told by your provider, bend your left / right elbow to a 90-degree angle (right angle) at your side. Using your uninjured hand, gently press over the palm of your left / right hand to bend your wrist and fingers toward the floor (extension). Go as far as you can to feel a stretch without causing pain. Hold this position for __________ seconds. Slowly return to the starting position. Repeat __________ times. Complete this exercise __________ times  a day. Forearm rotation, supination  Sit with your left / right elbow bent to a 90-degree angle (right angle). Turn (rotate) your left / right palm up until you cannot rotate it any more (supination). Then, use your other hand to help turn your left / right forearm more. Hold this position for __________ seconds. Slowly return to the starting position. Repeat __________ times. Complete this exercise __________ times a day. Forearm rotation, pronation  Sit with your left / right elbow bent to a 90-degree angle (right angle). Turn (rotate) your left / right palm down (pronation) until you cannot rotate it any more. Then, use your other hand to help turn your left / right forearm more. Hold this position for __________ seconds. Slowly return to the starting position. Repeat __________ times. Complete this exercise __________ times a day. Strengthening exercises These exercises build strength and endurance in your wrist and forearm. Endurance is the ability to use your muscles for a long time, even after they get tired. Wrist flexion  Sit with your left / right forearm resting on a table or other surface. Bend your elbow to a 90-degree angle (right angle) and rest your hand palm-up over the edge of the table. Hold a __________ weight in your left / right hand  or hold a rubber exercise band or tube. Place your left / right hand above the other hand. There should be slight tension in the exercise band or tube. Slowly curl your hand up toward the ceiling (flexion). Hold this position for __________ seconds. Slowly lower your hand back to the starting position. Repeat __________ times. Complete this exercise __________ times a day. Wrist extension  Sit with your left / right forearm resting on a table or other surface. Bend your elbow to a 90-degree angle (right angle) and rest your hand palm-down over the edge of the table. Hold a __________ weight in your left / right hand or hold a rubber exercise band or tube. Place your left / right hand above the other hand. There should be slight tension in the exercise band or tube. Slowly curl your hand up toward the ceiling (extension). Hold this position for __________ seconds. Slowly lower your hand back to the starting position. Repeat __________ times. Complete this exercise __________ times a day. Forearm rotation, supination  Sit with your left / right forearm resting on a table or other surface. Bend your elbow to a 90-degree angle (right angle). Position your forearm so that your thumb is facing the ceiling (neutral position) and your hand is resting over the edge of the table. Hold a hammer in your left / right hand. This exercise will be easier if you hold the hammer near the head of the hammer. This exercise will be harder if you hold the hammer near the end of the handle. Without moving your elbow, slowly rotate your palm up toward the ceiling (supination). Hold this position for __________ seconds. Slowly return to the starting position. Repeat __________ times. Complete this exercise __________ times a day. Forearm rotation, pronation  Sit with your left / right forearm resting on a table or other surface. Bend your elbow to a 90-degree angle (right angle). Position your forearm so that the  thumb is facing the ceiling (neutral position), with your hand resting over the edge of the table. Hold a hammer in your left / right hand. This exercise will be easier if you hold the hammer near the head of the hammer. This exercise will be  harder if you hold the hammer near the end of the handle. Without moving your elbow, slowly rotate your palm down toward the floor (pronation). Hold this position for __________ seconds. Slowly return to the starting position. Repeat __________ times. Complete this exercise __________ times a day. Grip strengthening  Grasp a stress ball or other ball in the middle of your left / right hand. Start with your elbow bent to a 90-degree angle (right angle). Slowly increasing the pressure, squeeze the ball as hard as you can without causing pain. Think of bringing the tips of your fingers into the middle of your palm. All of your finger joints should bend when doing this exercise. To make this exercise harder, slowly try to straighten your elbow in front of you, until you can do the exercise with your elbow fully straight. Hold your squeeze for __________ seconds, then relax. If told by your provider, do this exercise: With your forearm positioned so that the thumb is facing the ceiling (neutral position). With your forearm turned palm-down. With your forearm turned palm-up. Repeat __________ times. Complete this exercise __________ times a day. This information is not intended to replace advice given to you by your health care provider. Make sure you discuss any questions you have with your health care provider. Document Revised: 04/24/2022 Document Reviewed: 04/24/2022 [employer] Patient Education  2024 [employer]  Health Maintenance for Postmenopausal Women Menopause is a normal process in which your ability to get pregnant comes to an end. This process happens slowly over many months or years, usually between the ages of 1 and 66. Menopause is complete  when you have missed your menstrual period for 12 months. It is important to talk with your health care provider about some of the most common conditions that affect women after menopause (postmenopausal women). These include heart disease, cancer, and bone loss (osteoporosis). Adopting a healthy lifestyle and getting preventive care can help to promote your health and wellness. The actions you take can also lower your chances of developing some of these common conditions. What are the signs and symptoms of menopause? During menopause, you may have the following symptoms: Hot flashes. These can be moderate or severe. Night sweats. Decrease in sex drive. Mood swings. Headaches. Tiredness (fatigue). Irritability. Memory problems. Problems falling asleep or staying asleep. Talk with your health care provider about treatment options for your symptoms. Do I need hormone replacement therapy? Hormone replacement therapy is effective in treating symptoms that are caused by menopause, such as hot flashes and night sweats. Hormone replacement carries certain risks, especially as you become older. If you are thinking about using estrogen or estrogen with progestin, discuss the benefits and risks with your health care provider. How can I reduce my risk for heart disease and stroke? The risk of heart disease, heart attack, and stroke increases as you age. One of the causes may be a change in the body's hormones during menopause. This can affect how your body uses dietary fats, triglycerides, and cholesterol. Heart attack and stroke are medical emergencies. There are many things that you can do to help prevent heart disease and stroke. Watch your blood pressure High blood pressure causes heart disease and increases the risk of stroke. This is more likely to develop in people who have high blood pressure readings or are overweight. Have your blood pressure checked: Every 3-5 years if you are 18-39 years of  age. Every year if you are 35 years old or older. Eat a healthy  diet  Eat a diet that includes plenty of vegetables, fruits, low-fat dairy products, and lean protein. Do not eat a lot of foods that are high in solid fats, added sugars, or sodium. Get regular exercise Get regular exercise. This is one of the most important things you can do for your health. Most adults should: Try to exercise for at least 150 minutes each week. The exercise should increase your heart rate and make you sweat (moderate-intensity exercise). Try to do strengthening exercises at least twice each week. Do these in addition to the moderate-intensity exercise. Spend less time sitting. Even light physical activity can be beneficial. Other tips Work with your health care provider to achieve or maintain a healthy weight. Do not use any products that contain nicotine or tobacco. These products include cigarettes, chewing tobacco, and vaping devices, such as e-cigarettes. If you need help quitting, ask your health care provider. Know your numbers. Ask your health care provider to check your cholesterol and your blood sugar (glucose). Continue to have your blood tested as directed by your health care provider. Do I need screening for cancer? Depending on your health history and family history, you may need to have cancer screenings at different stages of your life. This may include screening for: Breast cancer. Cervical cancer. Lung cancer. Colorectal cancer. What is my risk for osteoporosis? After menopause, you may be at increased risk for osteoporosis. Osteoporosis is a condition in which bone destruction happens more quickly than new bone creation. To help prevent osteoporosis or the bone fractures that can happen because of osteoporosis, you may take the following actions: If you are 44-40 years old, get at least 1,000 mg of calcium and at least 600 international units (IU) of vitamin D per day. If you are older than  age 68 but younger than age 42, get at least 1,200 mg of calcium and at least 600 international units (IU) of vitamin D per day. If you are older than age 75, get at least 1,200 mg of calcium and at least 800 international units (IU) of vitamin D per day. Smoking and drinking excessive alcohol increase the risk of osteoporosis. Eat foods that are rich in calcium and vitamin D, and do weight-bearing exercises several times each week as directed by your health care provider. How does menopause affect my mental health? Depression may occur at any age, but it is more common as you become older. Common symptoms of depression include: Feeling depressed. Changes in sleep patterns. Changes in appetite or eating patterns. Feeling an overall lack of motivation or enjoyment of activities that you previously enjoyed. Frequent crying spells. Talk with your health care provider if you think that you are experiencing any of these symptoms. General instructions See your health care provider for regular wellness exams and vaccines. This may include: Scheduling regular health, dental, and eye exams. Getting and maintaining your vaccines. These include: Influenza vaccine. Get this vaccine each year before the flu season begins. Pneumonia vaccine. Shingles vaccine. Tetanus, diphtheria, and pertussis (Tdap) booster vaccine. Your health care provider may also recommend other immunizations. Tell your health care provider if you have ever been abused or do not feel safe at home. Summary Menopause is a normal process in which your ability to get pregnant comes to an end. This condition causes hot flashes, night sweats, decreased interest in sex, mood swings, headaches, or lack of sleep. Treatment for this condition may include hormone replacement therapy. Take actions to keep yourself healthy, including exercising  regularly, eating a healthy diet, watching your weight, and checking your blood pressure and blood  sugar levels. Get screened for cancer and depression. Make sure that you are up to date with all your vaccines. This information is not intended to replace advice given to you by your health care provider. Make sure you discuss any questions you have with your health care provider. Document Revised: 11/20/2020 Document Reviewed: 11/20/2020 Elsevier Patient Education  2024 [employer].

## 2022-12-06 NOTE — Assessment & Plan Note (Signed)
Patient declines clinical breast exam.  Mammogram is up-to-date.  She is no longer screening for colon cancer, cervical cancer.  Congratulated patient on diligence to exercise.

## 2022-12-06 NOTE — Assessment & Plan Note (Signed)
Benign exam.  I do question if the degree of osteoarthritis is playing a role.  Pending x-ray.  We discussed starting meloxicam for very brief period of time.  Counseled on the importance of taking with food to prevent gastritis.  If no improvement, consider orthopedic consult

## 2022-12-06 NOTE — Assessment & Plan Note (Signed)
Chronic, symptomatically stable.  Continue simvastatin 20 mg daily.  Pending lipid panel.

## 2022-12-30 ENCOUNTER — Encounter: Payer: Self-pay | Admitting: *Deleted

## 2022-12-30 ENCOUNTER — Telehealth: Payer: Self-pay | Admitting: *Deleted

## 2022-12-30 NOTE — Telephone Encounter (Signed)
$  234.78 due for Prolia & PA on file (5/23) & good for as long as she is on Prolia and insurance doesn't change.   Pt due on or after 01/14/23

## 2023-01-02 ENCOUNTER — Other Ambulatory Visit: Payer: Self-pay | Admitting: Family

## 2023-01-02 DIAGNOSIS — E785 Hyperlipidemia, unspecified: Secondary | ICD-10-CM

## 2023-01-20 ENCOUNTER — Ambulatory Visit: Payer: Medicare Other

## 2023-01-20 DIAGNOSIS — M810 Age-related osteoporosis without current pathological fracture: Secondary | ICD-10-CM

## 2023-01-20 MED ORDER — DENOSUMAB 60 MG/ML ~~LOC~~ SOSY
60.0000 mg | PREFILLED_SYRINGE | Freq: Once | SUBCUTANEOUS | Status: AC
  Administered 2023-01-20: 60 mg via SUBCUTANEOUS

## 2023-01-20 NOTE — Progress Notes (Signed)
Pt presented for their subcutaneous Prolia injection. Pt was identified through two identifiers. Pt was given the information packets about the Prolia and told to schedule their next injection 6 months out. Pt tolerated the subq injection well in the left arm.  

## 2023-01-31 ENCOUNTER — Telehealth: Payer: Self-pay | Admitting: Family

## 2023-01-31 DIAGNOSIS — M25532 Pain in left wrist: Secondary | ICD-10-CM

## 2023-01-31 NOTE — Telephone Encounter (Signed)
LMTCB. Need to find out if pt is still taking the Meloxicam for the wrist pain.

## 2023-02-03 NOTE — Telephone Encounter (Signed)
LvVM to call back. Need to find out if pt is still taking the Meloxicam for the wrist pain.

## 2023-02-03 NOTE — Telephone Encounter (Signed)
Pt called back and I read the message to her and she stated she was taking it everyday for a period of time and now she is taking it every other day

## 2023-02-14 ENCOUNTER — Ambulatory Visit: Admission: RE | Admit: 2023-02-14 | Payer: Medicare Other | Source: Ambulatory Visit

## 2023-02-14 ENCOUNTER — Telehealth: Payer: Self-pay | Admitting: Nurse Practitioner

## 2023-02-14 ENCOUNTER — Ambulatory Visit (INDEPENDENT_AMBULATORY_CARE_PROVIDER_SITE_OTHER): Payer: Medicare Other | Admitting: Nurse Practitioner

## 2023-02-14 ENCOUNTER — Telehealth: Payer: Self-pay

## 2023-02-14 VITALS — BP 122/84 | HR 76 | Temp 97.6°F | Ht 59.0 in | Wt 114.4 lb

## 2023-02-14 DIAGNOSIS — Y939 Activity, unspecified: Secondary | ICD-10-CM | POA: Insufficient documentation

## 2023-02-14 DIAGNOSIS — R55 Syncope and collapse: Secondary | ICD-10-CM

## 2023-02-14 DIAGNOSIS — W19XXXA Unspecified fall, initial encounter: Secondary | ICD-10-CM | POA: Insufficient documentation

## 2023-02-14 DIAGNOSIS — R9431 Abnormal electrocardiogram [ECG] [EKG]: Secondary | ICD-10-CM

## 2023-02-14 LAB — COMPREHENSIVE METABOLIC PANEL
ALT: 218 U/L — ABNORMAL HIGH (ref 0–35)
AST: 210 U/L — ABNORMAL HIGH (ref 0–37)
Albumin: 4.7 g/dL (ref 3.5–5.2)
Alkaline Phosphatase: 46 U/L (ref 39–117)
BUN: 14 mg/dL (ref 6–23)
CO2: 30 mEq/L (ref 19–32)
Calcium: 9.5 mg/dL (ref 8.4–10.5)
Chloride: 103 mEq/L (ref 96–112)
Creatinine, Ser: 0.58 mg/dL (ref 0.40–1.20)
GFR: 82.88 mL/min (ref >60.00)
Glucose, Bld: 121 mg/dL — ABNORMAL HIGH (ref 70–99)
Potassium: 4.1 mEq/L (ref 3.5–5.1)
Sodium: 140 mEq/L (ref 135–145)
Total Bilirubin: 0.6 mg/dL (ref 0.2–1.2)
Total Protein: 7.4 g/dL (ref 6.0–8.3)

## 2023-02-14 NOTE — Progress Notes (Incomplete)
Established Patient Office Visit  Subjective:  Patient ID: Janet Shields, female    DOB: 1936/05/12  Age: 87 y.o. MRN: [id number]  CC:  Chief Complaint  Patient presents with   Fall    HPI  Janet Shields presents for fall this morning around 8:00 am. She feel on concrete face down hit her right side.   She had  Called pt to discuss the highly elevated AST and ALT labs. Discussed with her the result is associated with liver injury and given her right sided fall and pain under the R rib area she require further imaging & close management. Advised patient to go to the ER for further workup.  She was agreeable to go to the ER this evening with her daughter.  HPI   Past Medical History:  Diagnosis Date   History of colon polyps    Hyperlipidemia    Osteoporosis    Osteoporosis     Past Surgical History:  Procedure Laterality Date   BREAST BIOPSY  2007   CORE W/CLIP - NEG   CATARACT EXTRACTION W/ INTRAOCULAR LENS IMPLANT Left 07/2018   CATARACT EXTRACTION W/PHACO Right 04/17/2022   Procedure: CATARACT EXTRACTION PHACO AND INTRAOCULAR LENS PLACEMENT (IOC) RIGHT  CLARION TORIC LENS 15.36 01:45.8;  Surgeon: Lockie Mola, MD;  Location: [facility];  Service: Ophthalmology;  Laterality: Right;    Family History  Problem Relation Age of Onset   Arthritis Mother    Alcohol abuse Father    Heart disease Father 46       CAD - died   Hyperlipidemia Brother    Diabetes Brother    Hypertension Brother    Diabetes Brother    Hyperlipidemia Brother    CAD Brother        quadruple bypass   Diabetes Brother    Breast cancer Neg Hx     Social History   Socioeconomic History   Marital status: Single    Spouse name: Not on file   Number of children: 1   Years of education: 18   Highest education level: Not on file  Occupational History   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  Tobacco Use   Smoking status: Never   Smokeless tobacco: Never   Vaping Use   Vaping status: Never Used  Substance and Sexual Activity   Alcohol use: Yes    Comment: Occasional wine with dinner   Drug use: No   Sexual activity: Not Currently  Other Topics Concern   Not on file  Social History Narrative   Hobbies: flower gardening, read, travel   Caffeine: 4 cups of coffee   Exercise: walk 3 miles on treadmill 3 a week, resistance exercises [employer]   Social Determinants of Health   Financial Resource Strain: Low Risk  (11/07/2022)   Overall Financial Resource Strain (CARDIA)    Difficulty of Paying Living Expenses: Not hard at all  Food Insecurity: No Food Insecurity (11/07/2022)   Hunger Vital Sign    Worried About Running Out of Food in the Last Year: Never true    Ran Out of Food in the Last Year: Never true  Transportation Needs: No Transportation Needs (11/07/2022)   PRAPARE - Administrator, Civil Service (Medical): No    Lack of Transportation (Non-Medical): No  Physical Activity: Sufficiently Active (11/07/2022)   Exercise Vital Sign    Days of Exercise per Week: 5 days    Minutes of Exercise per  Session: 60 min  Stress: No Stress Concern Present (11/07/2022)   [employer] of Occupational Health - Occupational Stress Questionnaire    Feeling of Stress : Not at all  Social Connections: Unknown (11/07/2022)   Social Connection and Isolation Panel [NHANES]    Frequency of Communication with Friends and Family: More than three times a week    Frequency of Social Gatherings with Friends and Family: More than three times a week    Attends Religious Services: More than 4 times per year    Active Member of [employer] or Organizations: Yes    Attends Engineer, structural: More than 4 times per year    Marital Status: Not on file  Intimate Partner Violence: Not At Risk (11/07/2022)   Humiliation, Afraid, Rape, and Kick questionnaire    Fear of Current or Ex-Partner: No    Emotionally Abused: No    Physically Abused: No     Sexually Abused: No     Outpatient Medications Prior to Visit  Medication Sig Dispense Refill   Biotin 5000 MCG TABS Take by mouth daily.     Calcium Citrate-Vitamin D (CAL-CITRATE PLUS VITAMIN D PO) Take 1 tablet by mouth daily.     CRANBERRY-VITAMIN C PO Take by mouth daily.     denosumab (PROLIA) 60 MG/ML SOLN injection Inject 60 mg into the skin every 6 (six) months. Administer in upper arm, thigh, or abdomen     latanoprost (XALATAN) 0.005 % ophthalmic solution 1 drop at bedtime.     meloxicam (MOBIC) 7.5 MG tablet TAKE 1 TABLET(7.5 MG) BY MOUTH DAILY AS NEEDED FOR PAIN 30 tablet 1   Multiple Vitamin (MULTIVITAMIN) tablet Take 1 tablet by mouth daily.     saccharomyces boulardii (FLORASTOR) 250 MG capsule Take 250 mg by mouth daily.     simvastatin (ZOCOR) 20 MG tablet TAKE 1 TABLET(20 MG) BY MOUTH DAILY AT 6 PM 30 tablet 5   timolol (TIMOPTIC) 0.5 % ophthalmic solution Place 1 drop into both eyes daily.     No facility-administered medications prior to visit.    Allergies  Allergen Reactions   Tramadol Nausea And Vomiting    ROS Review of Systems Negative unless indicated in HPI.    Objective:    Physical Exam  There were no vitals taken for this visit. Wt Readings from Last 3 Encounters:  12/06/22 116 lb (52.6 kg)  11/07/22 115 lb (52.2 kg)  04/17/22 115 lb 14.4 oz (52.6 kg)     Health Maintenance  Topic Date Due   Zoster Vaccines- Shingrix (1 of 2) 04/30/1988   COVID-19 Vaccine (4 - 2023-24 season) 03/15/2022   INFLUENZA VACCINE  02/13/2023   Medicare Annual Wellness (AWV)  11/07/2023   DTaP/Tdap/Td (3 - Tdap) 03/13/2032   Pneumonia Vaccine 49+ Years old  Completed   DEXA SCAN  Completed   HPV VACCINES  Aged Out    There are no preventive care reminders to display for this patient.  Lab Results  Component Value Date   TSH 1.77 12/06/2022   Lab Results  Component Value Date   WBC 4.0 12/06/2022   HGB 13.7 12/06/2022   HCT 41.6 12/06/2022   MCV  91.3 12/06/2022   PLT 233.0 12/06/2022   Lab Results  Component Value Date   NA 142 12/06/2022   K 4.1 12/06/2022   CO2 32 12/06/2022   GLUCOSE 99 12/06/2022   BUN 15 12/06/2022   CREATININE 0.61 12/06/2022   BILITOT 0.5 12/06/2022  ALKPHOS 29 (L) 12/06/2022   AST 24 12/06/2022   ALT 22 12/06/2022   PROT 6.9 12/06/2022   ALBUMIN 4.3 12/06/2022   CALCIUM 9.4 12/06/2022   ANIONGAP 9 10/17/2017   GFR 81.99 12/06/2022   Lab Results  Component Value Date   CHOL 160 12/06/2022   Lab Results  Component Value Date   HDL 62.80 12/06/2022   Lab Results  Component Value Date   LDLCALC 83 12/06/2022   Lab Results  Component Value Date   TRIG 73.0 12/06/2022   Lab Results  Component Value Date   CHOLHDL 3 12/06/2022   Lab Results  Component Value Date   HGBA1C 6.1 12/06/2022      Assessment & Plan:  Fall, initial encounter -     CT HEAD WO CONTRAST ( ); Future -     EKG 12-Lead  Abnormal EKG    Follow-up: No follow-ups on file.   Kara Dies, NP

## 2023-02-14 NOTE — Telephone Encounter (Unsigned)
Called pt to 5:23 PM to discuss the highly elevated AST and ALT lab results. Discussed with her the result can be associated with liver injury and given her right sided fall and pain under the R rib area she require further imaging & close management. Advised patient to go to the ER for further workup.  She was agreeable to go to the ER this evening with her daughter.

## 2023-02-14 NOTE — Telephone Encounter (Signed)
Called pt and let know results of imaging.

## 2023-02-14 NOTE — Patient Instructions (Addendum)
Referral has sent to cardiology. Will go to the lab for blood work. Use voltern gel for pain on the side. Please go to outpatient Imaging for CT

## 2023-02-14 NOTE — Progress Notes (Unsigned)
Established Patient Office Visit  Subjective:  Patient ID: Janet Shields, female    DOB: 1936/05/12  Age: 87 y.o. MRN: [id number]  CC:  Chief Complaint  Patient presents with   Fall    HPI  Janet Shields presents for fall this morning around 8:00 am. She feel on concrete face down hit her right side.   She had  Called pt to discuss the highly elevated AST and ALT labs. Discussed with her the result is associated with liver injury and given her right sided fall and pain under the R rib area she require further imaging & close management. Advised patient to go to the ER for further workup.  She was agreeable to go to the ER this evening with her daughter.  HPI   Past Medical History:  Diagnosis Date   History of colon polyps    Hyperlipidemia    Osteoporosis    Osteoporosis     Past Surgical History:  Procedure Laterality Date   BREAST BIOPSY  2007   CORE W/CLIP - NEG   CATARACT EXTRACTION W/ INTRAOCULAR LENS IMPLANT Left 07/2018   CATARACT EXTRACTION W/PHACO Right 04/17/2022   Procedure: CATARACT EXTRACTION PHACO AND INTRAOCULAR LENS PLACEMENT (IOC) RIGHT  CLARION TORIC LENS 15.36 01:45.8;  Surgeon: Lockie Mola, MD;  Location: [facility];  Service: Ophthalmology;  Laterality: Right;    Family History  Problem Relation Age of Onset   Arthritis Mother    Alcohol abuse Father    Heart disease Father 46       CAD - died   Hyperlipidemia Brother    Diabetes Brother    Hypertension Brother    Diabetes Brother    Hyperlipidemia Brother    CAD Brother        quadruple bypass   Diabetes Brother    Breast cancer Neg Hx     Social History   Socioeconomic History   Marital status: Single    Spouse name: Not on file   Number of children: 1   Years of education: 18   Highest education level: Not on file  Occupational History   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  Tobacco Use   Smoking status: Never   Smokeless tobacco: Never   Vaping Use   Vaping status: Never Used  Substance and Sexual Activity   Alcohol use: Yes    Comment: Occasional wine with dinner   Drug use: No   Sexual activity: Not Currently  Other Topics Concern   Not on file  Social History Narrative   Hobbies: flower gardening, read, travel   Caffeine: 4 cups of coffee   Exercise: walk 3 miles on treadmill 3 a week, resistance exercises [employer]   Social Determinants of Health   Financial Resource Strain: Low Risk  (11/07/2022)   Overall Financial Resource Strain (CARDIA)    Difficulty of Paying Living Expenses: Not hard at all  Food Insecurity: No Food Insecurity (11/07/2022)   Hunger Vital Sign    Worried About Running Out of Food in the Last Year: Never true    Ran Out of Food in the Last Year: Never true  Transportation Needs: No Transportation Needs (11/07/2022)   PRAPARE - Administrator, Civil Service (Medical): No    Lack of Transportation (Non-Medical): No  Physical Activity: Sufficiently Active (11/07/2022)   Exercise Vital Sign    Days of Exercise per Week: 5 days    Minutes of Exercise per  Session: 60 min  Stress: No Stress Concern Present (11/07/2022)   [employer] of Occupational Health - Occupational Stress Questionnaire    Feeling of Stress : Not at all  Social Connections: Unknown (11/07/2022)   Social Connection and Isolation Panel [NHANES]    Frequency of Communication with Friends and Family: More than three times a week    Frequency of Social Gatherings with Friends and Family: More than three times a week    Attends Religious Services: More than 4 times per year    Active Member of [employer] or Organizations: Yes    Attends Engineer, structural: More than 4 times per year    Marital Status: Not on file  Intimate Partner Violence: Not At Risk (11/07/2022)   Humiliation, Afraid, Rape, and Kick questionnaire    Fear of Current or Ex-Partner: No    Emotionally Abused: No    Physically Abused: No     Sexually Abused: No     Outpatient Medications Prior to Visit  Medication Sig Dispense Refill   Biotin 5000 MCG TABS Take by mouth daily.     Calcium Citrate-Vitamin D (CAL-CITRATE PLUS VITAMIN D PO) Take 1 tablet by mouth daily.     CRANBERRY-VITAMIN C PO Take by mouth daily.     denosumab (PROLIA) 60 MG/ML SOLN injection Inject 60 mg into the skin every 6 (six) months. Administer in upper arm, thigh, or abdomen     latanoprost (XALATAN) 0.005 % ophthalmic solution 1 drop at bedtime.     meloxicam (MOBIC) 7.5 MG tablet TAKE 1 TABLET(7.5 MG) BY MOUTH DAILY AS NEEDED FOR PAIN 30 tablet 1   Multiple Vitamin (MULTIVITAMIN) tablet Take 1 tablet by mouth daily.     saccharomyces boulardii (FLORASTOR) 250 MG capsule Take 250 mg by mouth daily.     simvastatin (ZOCOR) 20 MG tablet TAKE 1 TABLET(20 MG) BY MOUTH DAILY AT 6 PM 30 tablet 5   timolol (TIMOPTIC) 0.5 % ophthalmic solution Place 1 drop into both eyes daily.     No facility-administered medications prior to visit.    Allergies  Allergen Reactions   Tramadol Nausea And Vomiting    ROS Review of Systems Negative unless indicated in HPI.    Objective:    Physical Exam  There were no vitals taken for this visit. Wt Readings from Last 3 Encounters:  12/06/22 116 lb (52.6 kg)  11/07/22 115 lb (52.2 kg)  04/17/22 115 lb 14.4 oz (52.6 kg)     Health Maintenance  Topic Date Due   Zoster Vaccines- Shingrix (1 of 2) 04/30/1988   COVID-19 Vaccine (4 - 2023-24 season) 03/15/2022   INFLUENZA VACCINE  02/13/2023   Medicare Annual Wellness (AWV)  11/07/2023   DTaP/Tdap/Td (3 - Tdap) 03/13/2032   Pneumonia Vaccine 49+ Years old  Completed   DEXA SCAN  Completed   HPV VACCINES  Aged Out    There are no preventive care reminders to display for this patient.  Lab Results  Component Value Date   TSH 1.77 12/06/2022   Lab Results  Component Value Date   WBC 4.0 12/06/2022   HGB 13.7 12/06/2022   HCT 41.6 12/06/2022   MCV  91.3 12/06/2022   PLT 233.0 12/06/2022   Lab Results  Component Value Date   NA 142 12/06/2022   K 4.1 12/06/2022   CO2 32 12/06/2022   GLUCOSE 99 12/06/2022   BUN 15 12/06/2022   CREATININE 0.61 12/06/2022   BILITOT 0.5 12/06/2022  ALKPHOS 29 (L) 12/06/2022   AST 24 12/06/2022   ALT 22 12/06/2022   PROT 6.9 12/06/2022   ALBUMIN 4.3 12/06/2022   CALCIUM 9.4 12/06/2022   ANIONGAP 9 10/17/2017   GFR 81.99 12/06/2022   Lab Results  Component Value Date   CHOL 160 12/06/2022   Lab Results  Component Value Date   HDL 62.80 12/06/2022   Lab Results  Component Value Date   LDLCALC 83 12/06/2022   Lab Results  Component Value Date   TRIG 73.0 12/06/2022   Lab Results  Component Value Date   CHOLHDL 3 12/06/2022   Lab Results  Component Value Date   HGBA1C 6.1 12/06/2022      Assessment & Plan:  Fall, initial encounter -     CT HEAD WO CONTRAST ( ); Future -     EKG 12-Lead  Abnormal EKG    Follow-up: No follow-ups on file.   Kara Dies, NP

## 2023-02-14 NOTE — Progress Notes (Signed)
Please inform pt her CT showed no acute changes.

## 2023-02-16 ENCOUNTER — Encounter: Payer: Self-pay | Admitting: Nurse Practitioner

## 2023-02-16 DIAGNOSIS — R55 Syncope and collapse: Secondary | ICD-10-CM | POA: Insufficient documentation

## 2023-02-16 DIAGNOSIS — R9431 Abnormal electrocardiogram [ECG] [EKG]: Secondary | ICD-10-CM | POA: Insufficient documentation

## 2023-02-16 DIAGNOSIS — W19XXXA Unspecified fall, initial encounter: Secondary | ICD-10-CM | POA: Insufficient documentation

## 2023-02-16 NOTE — Assessment & Plan Note (Signed)
Sinus rhythm with nonspecific T wave changes. Will refer to cardiology for further evaluation.

## 2023-02-16 NOTE — Assessment & Plan Note (Signed)
The lump on the head with bruising and syncopal episode.  Stat head CT  ordered to rule out internal bleeding, fracture or brain swelling. She denied chest x-ray at present and state would let us know if symptoms are not improving. Check metabolic panel and magnesium.

## 2023-02-16 NOTE — Assessment & Plan Note (Signed)
Stat CT of the head ordered.

## 2023-02-17 ENCOUNTER — Ambulatory Visit (INDEPENDENT_AMBULATORY_CARE_PROVIDER_SITE_OTHER): Payer: Medicare Other

## 2023-02-17 DIAGNOSIS — R55 Syncope and collapse: Secondary | ICD-10-CM

## 2023-02-17 LAB — MAGNESIUM: Magnesium: 2.2 mg/dL (ref 1.5–2.5)

## 2023-02-17 NOTE — Telephone Encounter (Signed)
FYI charan   Noted  She was seen at [facility] and now at [facility] outpatient receiving observation services.   Please sch hospital follow-up appointment with me when she is discharged from [facility]

## 2023-02-18 ENCOUNTER — Telehealth: Payer: Self-pay

## 2023-02-18 NOTE — Transitions of Care (Post Inpatient/ED Visit) (Signed)
   02/18/2023  Name: Janet Shields MRN: [id number] DOB: 1936/02/27  Today's TOC FU Call Status: Today's TOC FU Call Status:: Successful TOC FU Call Completed Unsuccessful Call (1st Attempt) Date: 02/18/23 [facility] FU Call Complete Date: 02/18/23  Transition Care Management Follow-up Telephone Call Date of Discharge: 02/17/23 Discharge Facility: Other [facility]) Name of Other (Non[facility]) Discharge Facility: [facility] Type of Discharge: Inpatient Admission Primary Inpatient Discharge Diagnosis:: fall How have you been since you were released from the hospital?: Same  Items Reviewed: Did you receive and understand the discharge instructions provided?: Yes Medications obtained,verified, and reconciled?: Yes (Medications Reviewed) Any new allergies since your discharge?: No Dietary orders reviewed?: Yes Do you have support at home?: Yes People in Home: child(ren), adult  Medications Reviewed Today: Medications Reviewed Today     Reviewed by Karena Addison, LPN (Licensed Practical Nurse) on 02/18/23 at [id number]  Med List Status: <None>   Medication Order Taking? Sig Documenting Provider Last Dose Status Informant  Biotin 5000 MCG TABS [id number] No Take by mouth daily. [provider] Taking Active   Calcium Citrate-Vitamin D (CAL-CITRATE PLUS VITAMIN D PO) [id number] No Take 1 tablet by mouth daily. [provider] Taking Active Self  CRANBERRY-VITAMIN C PO [id number] No Take by mouth daily. [provider] Taking Active   denosumab (PROLIA) 60 MG/ML SOLN injection [id number] No Inject 60 mg into the skin every 6 (six) months. Administer in upper arm, thigh, or abdomen [provider] Taking Active Self  latanoprost (XALATAN) 0.005 % ophthalmic solution [id number] No 1 drop at bedtime. [provider] Taking Active   meloxicam (MOBIC) 7.5 MG tablet [id number]  TAKE 1 TABLET(7.5 MG) BY MOUTH DAILY AS NEEDED FOR PAIN Allegra Grana, FNP   Active   Multiple Vitamin (MULTIVITAMIN) tablet [id number] No Take 1 tablet by mouth daily. [provider] Taking Active Self  saccharomyces boulardii (FLORASTOR) 250 MG capsule [id number] No Take 250 mg by mouth daily. [provider] Taking Active   simvastatin (ZOCOR) 20 MG tablet [id number]  TAKE 1 TABLET(20 MG) BY MOUTH DAILY AT 6 PM Arnett, Lyn Records, FNP  Active   timolol (TIMOPTIC) 0.5 % ophthalmic solution [id number] No Place 1 drop into both eyes daily. [provider] Taking Active             Home Care and Equipment/Supplies: Were Home Health Services Ordered?: NA Any new equipment or medical supplies ordered?: NA  Functional Questionnaire: Do you need assistance with bathing/showering or dressing?: Yes Do you need assistance with meal preparation?: No Do you need assistance with eating?: No Do you have difficulty maintaining continence: No Do you need assistance with getting out of bed/getting out of a chair/moving?: Yes Do you have difficulty managing or taking your medications?: No  Follow up appointments reviewed: PCP Follow-up appointment confirmed?: NA Specialist Hospital Follow-up appointment confirmed?: Yes Follow-Up Specialty Provider:: cardio Do you need transportation to your follow-up appointment?: No Do you understand care options if your condition(s) worsen?: Yes-patient verbalized understanding    SIGNATURE Karena Addison, LPN [facility] Nurse Health Advisor Direct Dial [phone]

## 2023-02-18 NOTE — Telephone Encounter (Addendum)
LVM to call back to schedule f/up appt after discharge from [facility]

## 2023-02-18 NOTE — Transitions of Care (Post Inpatient/ED Visit) (Signed)
   02/18/2023  Name: Janet Shields MRN: [id number] DOB: April 08, 1936  Today's TOC FU Call Status: Today's TOC FU Call Status:: Unsuccessful Call (1st Attempt) Unsuccessful Call (1st Attempt) Date: 02/18/23  Attempted to reach the patient regarding the most recent Inpatient/ED visit.  Follow Up Plan: Additional outreach attempts will be made to reach the patient to complete the Transitions of Care (Post Inpatient/ED visit) call.   Signature Karena Addison, LPN [facility] Nurse Health Advisor Direct Dial [phone]

## 2023-02-19 ENCOUNTER — Telehealth: Payer: Self-pay

## 2023-02-19 ENCOUNTER — Other Ambulatory Visit: Payer: Self-pay | Admitting: Family

## 2023-02-19 ENCOUNTER — Ambulatory Visit
Admission: RE | Admit: 2023-02-19 | Discharge: 2023-02-19 | Disposition: A | Discharge status: Home / Self Care | Payer: Medicare Other | Source: Ambulatory Visit | Attending: Family | Admitting: Family

## 2023-02-19 DIAGNOSIS — Z780 Asymptomatic menopausal state: Secondary | ICD-10-CM | POA: Diagnosis present

## 2023-02-19 DIAGNOSIS — M810 Age-related osteoporosis without current pathological fracture: Secondary | ICD-10-CM

## 2023-02-19 NOTE — Transitions of Care (Post Inpatient/ED Visit) (Signed)
   02/19/2023  Name: Janet Shields MRN: [id number] DOB: 20-Nov-1935  ERROR

## 2023-03-03 ENCOUNTER — Other Ambulatory Visit: Payer: Self-pay | Admitting: Family

## 2023-03-03 DIAGNOSIS — M810 Age-related osteoporosis without current pathological fracture: Secondary | ICD-10-CM

## 2023-03-26 ENCOUNTER — Encounter: Payer: Self-pay | Admitting: Family

## 2023-03-27 NOTE — Telephone Encounter (Signed)
Noted  

## 2023-04-09 ENCOUNTER — Other Ambulatory Visit: Payer: Self-pay | Admitting: Family

## 2023-04-09 DIAGNOSIS — E785 Hyperlipidemia, unspecified: Secondary | ICD-10-CM

## 2023-05-20 ENCOUNTER — Other Ambulatory Visit: Payer: Self-pay | Admitting: Family

## 2023-05-20 DIAGNOSIS — Z1231 Encounter for screening mammogram for malignant neoplasm of breast: Secondary | ICD-10-CM

## 2023-06-23 ENCOUNTER — Ambulatory Visit
Admission: RE | Admit: 2023-06-23 | Discharge: 2023-06-23 | Disposition: A | Discharge status: Home / Self Care | Payer: Medicare Other | Source: Ambulatory Visit | Attending: Family | Admitting: Family

## 2023-06-23 DIAGNOSIS — Z1231 Encounter for screening mammogram for malignant neoplasm of breast: Secondary | ICD-10-CM | POA: Diagnosis present

## 2023-07-24 ENCOUNTER — Ambulatory Visit: Payer: Medicare Other

## 2023-08-08 ENCOUNTER — Telehealth: Payer: Self-pay | Admitting: *Deleted

## 2023-08-08 NOTE — Telephone Encounter (Signed)
Pt now getting Prolia at Endo for [facility].  Our records will now be archived

## 2024-04-17 ENCOUNTER — Other Ambulatory Visit: Payer: Self-pay | Admitting: Family

## 2024-04-17 DIAGNOSIS — E785 Hyperlipidemia, unspecified: Secondary | ICD-10-CM

## 2024-04-20 ENCOUNTER — Other Ambulatory Visit: Payer: Self-pay

## 2024-04-20 DIAGNOSIS — E785 Hyperlipidemia, unspecified: Secondary | ICD-10-CM

## 2024-04-20 MED ORDER — SIMVASTATIN 20 MG PO TABS: ORAL_TABLET | ORAL | 0 refills | Status: AC

## 2024-04-20 NOTE — Telephone Encounter (Signed)
 LVM to call back to office to inform pt that per Rollene. Please schedule f/up and document       She Provided 60 day courtesy refill and sch f/u with me
